# Patient Record
Sex: Male | Born: 1985 | Race: Black or African American | Hispanic: No | Marital: Married | Smoking: Never smoker
Health system: Southern US, Community
[De-identification: ages and names within clinical notes are randomized; demographics above are authoritative.]

## PROBLEM LIST (undated history)

## (undated) ENCOUNTER — Ambulatory Visit (HOSPITAL_COMMUNITY): Admission: EM | Disposition: A | Discharge status: Home / Self Care | Payer: Managed Care, Other (non HMO)

## (undated) DIAGNOSIS — E039 Hypothyroidism, unspecified: Secondary | ICD-10-CM

## (undated) DIAGNOSIS — E0590 Thyrotoxicosis, unspecified without thyrotoxic crisis or storm: Secondary | ICD-10-CM

## (undated) DIAGNOSIS — N289 Disorder of kidney and ureter, unspecified: Secondary | ICD-10-CM

## (undated) HISTORY — DX: Thyrotoxicosis, unspecified without thyrotoxic crisis or storm: E05.90

---
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
## 2006-06-26 ENCOUNTER — Emergency Department (HOSPITAL_COMMUNITY): Admission: EM | Admit: 2006-06-26 | Discharge: 2006-06-26 | Payer: Self-pay | Admitting: Emergency Medicine

## 2015-12-13 ENCOUNTER — Ambulatory Visit (INDEPENDENT_AMBULATORY_CARE_PROVIDER_SITE_OTHER): Payer: Managed Care, Other (non HMO)

## 2015-12-13 ENCOUNTER — Ambulatory Visit (HOSPITAL_COMMUNITY)
Admission: EM | Admit: 2015-12-13 | Discharge: 2015-12-13 | Disposition: A | Discharge status: Home / Self Care | Payer: Managed Care, Other (non HMO) | Attending: Family Medicine | Admitting: Family Medicine

## 2015-12-13 ENCOUNTER — Encounter (HOSPITAL_COMMUNITY): Payer: Self-pay | Admitting: Emergency Medicine

## 2015-12-13 DIAGNOSIS — S62316A Displaced fracture of base of fifth metacarpal bone, right hand, initial encounter for closed fracture: Secondary | ICD-10-CM | POA: Diagnosis not present

## 2015-12-13 MED ORDER — IBUPROFEN 800 MG PO TABS: 800.0000 mg | ORAL_TABLET | Freq: Three times a day (TID) | ORAL | 0 refills | Status: DC

## 2015-12-13 NOTE — Discharge Instructions (Signed)
Your hand is broken. Keep the splint on until you see the bone doctor. Call tomorrow to the Orthopedic office and schedule an appointment.
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
## 2015-12-13 NOTE — ED Notes (Signed)
Ortho Tech paged.
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
## 2015-12-14 ENCOUNTER — Other Ambulatory Visit (INDEPENDENT_AMBULATORY_CARE_PROVIDER_SITE_OTHER): Payer: Self-pay | Admitting: Orthopaedic Surgery

## 2015-12-14 ENCOUNTER — Ambulatory Visit (INDEPENDENT_AMBULATORY_CARE_PROVIDER_SITE_OTHER): Payer: Managed Care, Other (non HMO) | Admitting: Orthopaedic Surgery

## 2015-12-14 DIAGNOSIS — S62345A Nondisplaced fracture of base of fourth metacarpal bone, left hand, initial encounter for closed fracture: Secondary | ICD-10-CM

## 2015-12-14 DIAGNOSIS — S62346A Nondisplaced fracture of base of fifth metacarpal bone, right hand, initial encounter for closed fracture: Secondary | ICD-10-CM | POA: Diagnosis not present

## 2015-12-14 DIAGNOSIS — M79641 Pain in right hand: Secondary | ICD-10-CM

## 2015-12-19 ENCOUNTER — Ambulatory Visit
Admission: RE | Admit: 2015-12-19 | Discharge: 2015-12-19 | Disposition: A | Discharge status: Home / Self Care | Payer: Managed Care, Other (non HMO) | Source: Ambulatory Visit | Attending: Orthopaedic Surgery | Admitting: Orthopaedic Surgery

## 2015-12-19 DIAGNOSIS — M79641 Pain in right hand: Secondary | ICD-10-CM

## 2015-12-21 ENCOUNTER — Ambulatory Visit (INDEPENDENT_AMBULATORY_CARE_PROVIDER_SITE_OTHER): Payer: Managed Care, Other (non HMO) | Admitting: Orthopaedic Surgery

## 2015-12-21 DIAGNOSIS — S62346D Nondisplaced fracture of base of fifth metacarpal bone, right hand, subsequent encounter for fracture with routine healing: Secondary | ICD-10-CM

## 2016-01-11 ENCOUNTER — Ambulatory Visit (INDEPENDENT_AMBULATORY_CARE_PROVIDER_SITE_OTHER): Payer: Managed Care, Other (non HMO)

## 2016-01-11 ENCOUNTER — Telehealth (INDEPENDENT_AMBULATORY_CARE_PROVIDER_SITE_OTHER): Payer: Self-pay | Admitting: Orthopaedic Surgery

## 2016-01-11 ENCOUNTER — Encounter (INDEPENDENT_AMBULATORY_CARE_PROVIDER_SITE_OTHER): Payer: Self-pay | Admitting: Orthopaedic Surgery

## 2016-01-11 ENCOUNTER — Ambulatory Visit (INDEPENDENT_AMBULATORY_CARE_PROVIDER_SITE_OTHER): Payer: Managed Care, Other (non HMO) | Admitting: Orthopaedic Surgery

## 2016-01-11 DIAGNOSIS — S62345D Nondisplaced fracture of base of fourth metacarpal bone, left hand, subsequent encounter for fracture with routine healing: Secondary | ICD-10-CM | POA: Diagnosis not present

## 2016-01-11 DIAGNOSIS — S62346D Nondisplaced fracture of base of fifth metacarpal bone, right hand, subsequent encounter for fracture with routine healing: Secondary | ICD-10-CM

## 2016-01-11 NOTE — Progress Notes (Signed)
   Office Visit Note   Patient: Alan Watkins           Date of Birth: 05/10/1985           MRN: [id number] Visit Date: 01/11/2016              Requested by: No referring provider defined for this encounter. PCP: No PCP Per Patient   Assessment & Plan: Visit Diagnoses:  1. Closed nondisplaced fracture of base of fourth metacarpal bone of left hand with routine healing, subsequent encounter   2. Closed nondisplaced fracture of base of fifth metacarpal bone of right hand with routine healing, subsequent encounter     Plan:  - d/c cast - begin hand therapy x 4 weeks - velcro wrist brace at work - f/u 4 weeks, anticipate discharging at that time - may return to full duty  Follow-Up Instructions: Return in about 4 weeks (around 02/08/2016) for recheck right hand.   Orders:  Orders Placed This Encounter  Procedures  . XR Hand Complete Right   No orders of the defined types were placed in this encounter.     Procedures: No procedures performed   Clinical Data: No additional findings.   Subjective: Chief Complaint  Patient presents with  . Right Hand - Pain, Follow-up, Fracture    4 week f/u visit for 4th and 5th MC base fx.  Doing well.  Minimal pain.    Review of Systems   Objective: Vital Signs: There were no vitals taken for this visit.  Physical Exam  Right Hand Exam   Tenderness  The patient is experiencing tenderness in the dorsal area.  Range of Motion   Wrist  Extension: normal  Flexion: normal  Pronation: normal  Supination: normal   Comments:  Expected mild stiffness of fingers from casting.  No swelling.      Specialty Comments:  No specialty comments available.  Imaging: No results found.   PMFS History: There are no active problems to display for this patient.  No past medical history on file.  No family history on file.  No past surgical history on file. Social History   Occupational History  . Not on file.    Social History Main Topics  . Smoking status: Never Smoker  . Smokeless tobacco: Never Used  . Alcohol use Yes     Comment: Occasional  . Drug use: No  . Sexual activity: Not on file

## 2016-01-11 NOTE — Telephone Encounter (Signed)
Patient called advised he called the hand clinic on [address] and was told they did not have a certified hand specialist on site. Patient asked if he need to go to a certified hand specialist that was checked on the referral.  The number to contact patient is [phone]

## 2016-01-11 NOTE — Telephone Encounter (Signed)
Please advise 

## 2016-01-11 NOTE — Telephone Encounter (Signed)
he can see the OT there

## 2016-01-12 NOTE — Telephone Encounter (Signed)
[facility]

## 2016-01-16 ENCOUNTER — Encounter (INDEPENDENT_AMBULATORY_CARE_PROVIDER_SITE_OTHER): Payer: Self-pay | Admitting: Orthopaedic Surgery

## 2016-01-16 ENCOUNTER — Telehealth (INDEPENDENT_AMBULATORY_CARE_PROVIDER_SITE_OTHER): Payer: Self-pay | Admitting: Orthopaedic Surgery

## 2016-01-16 NOTE — Telephone Encounter (Signed)
Alan Watkins with PT and Hand specialist called needing a signed order and any recent notes faxed to her. Patient has an appointment tomorrow at 4:00pm  The fax # is [phone]   The ph# is [phone]

## 2016-02-09 ENCOUNTER — Ambulatory Visit (INDEPENDENT_AMBULATORY_CARE_PROVIDER_SITE_OTHER): Payer: Managed Care, Other (non HMO) | Admitting: Specialist

## 2017-03-11 HISTORY — PX: VASECTOMY: SHX75

## 2018-07-19 IMAGING — CT CT HAND*R* W/O CM
3 of 4 series · 12 of 29 positions shown, 14 images · non-contrast
Comparison: None.

CLINICAL DATA: Fifth metacarpal base fracture.  Hand pain.

EXAM:
CT OF THE RIGHT HAND WITHOUT CONTRAST
TECHNIQUE: Multidetector CT imaging of the right hand was performed according
to the standard protocol. Multiplanar CT image reconstructions were
also generated.

[Series 5: upper ext soft · axial · 0.38mm/px · z∈[+28,+146]mm · 3 of 95 slices shown]
[im 24/95  soft-tissue]
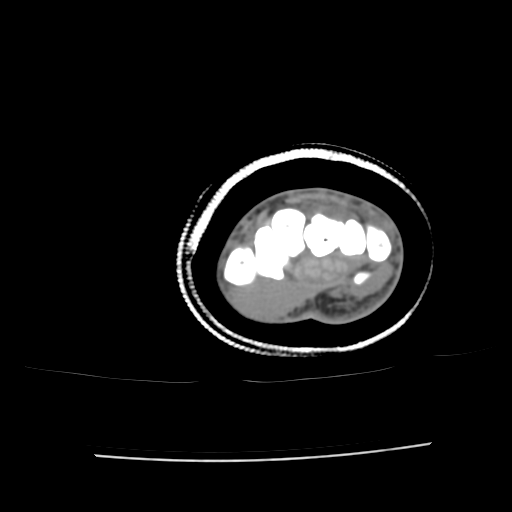
[im 48/95  soft-tissue]
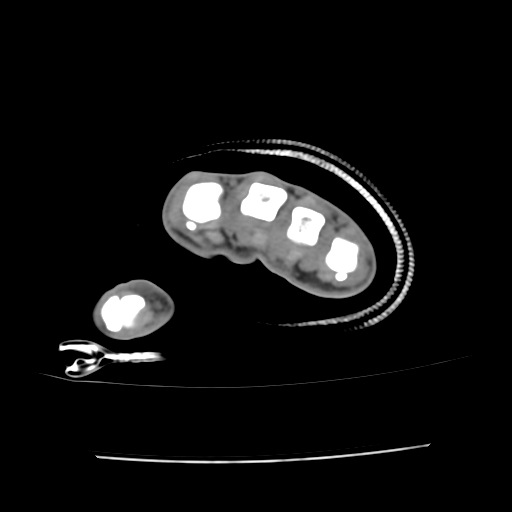
[im 71/95  soft-tissue]
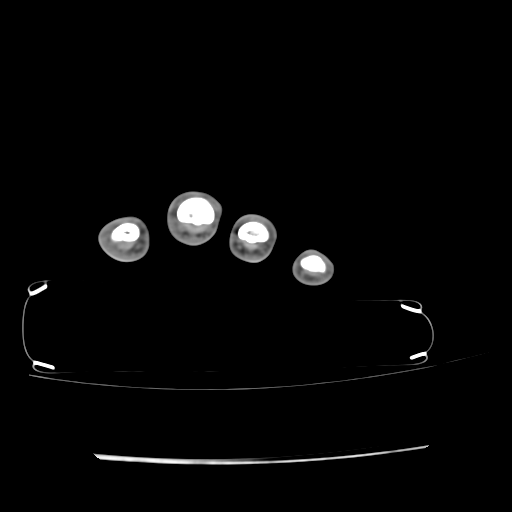

[Series 300: sag soft · sagittal · 0.47mm/px · 5 of 69 slices shown, 6 images]
[im 18/69  bone]
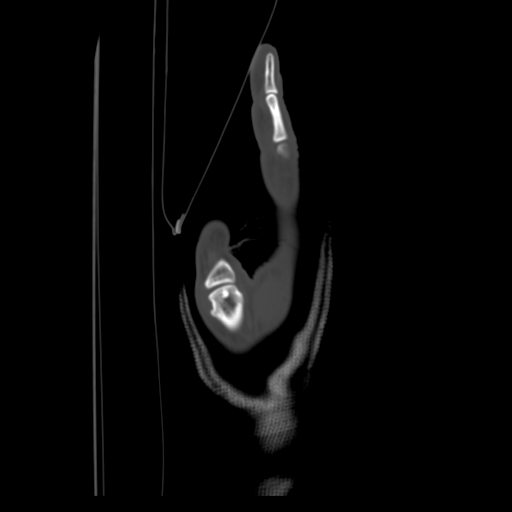
[im 23/69  bone]
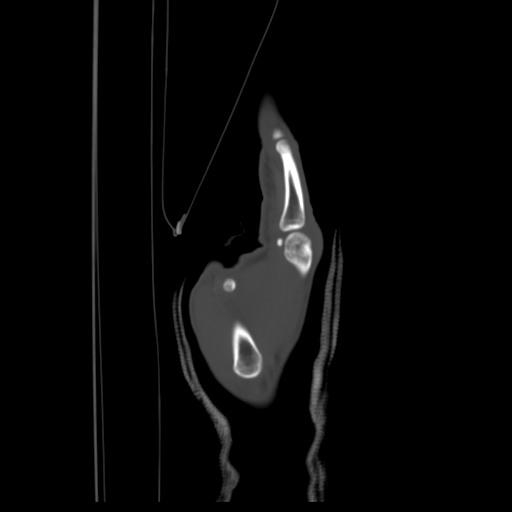
[im 29/69  bone]
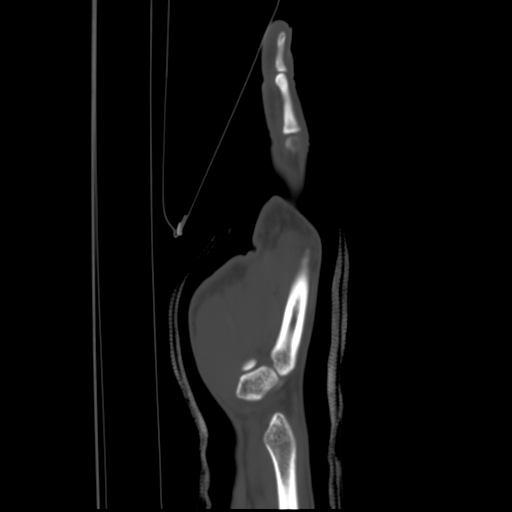
[im 35/69  soft-tissue]
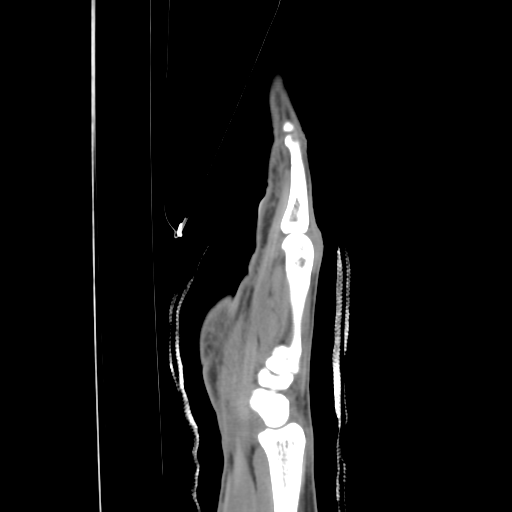
[im 35/69  bone]
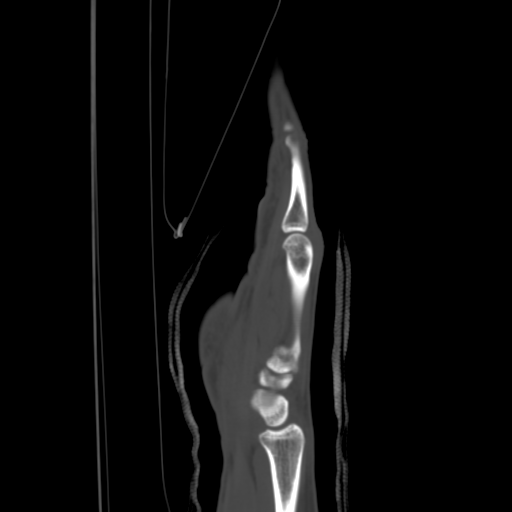
[im 40/69  bone]
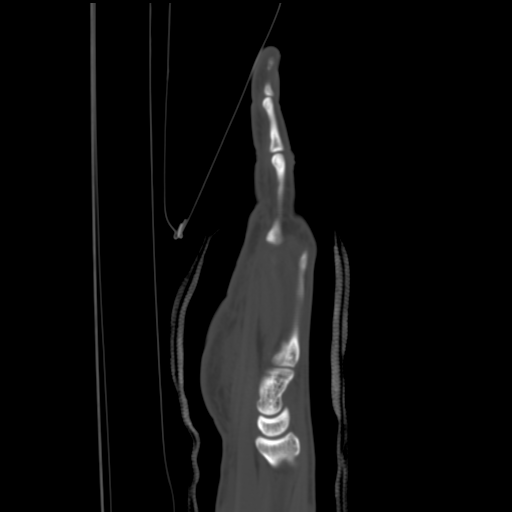

[Series 301: axial soft · axial · 0.47mm/px · z∈[+55,+194]mm · 4 of 121 slices shown, 5 images]
[im 25/121  soft-tissue]
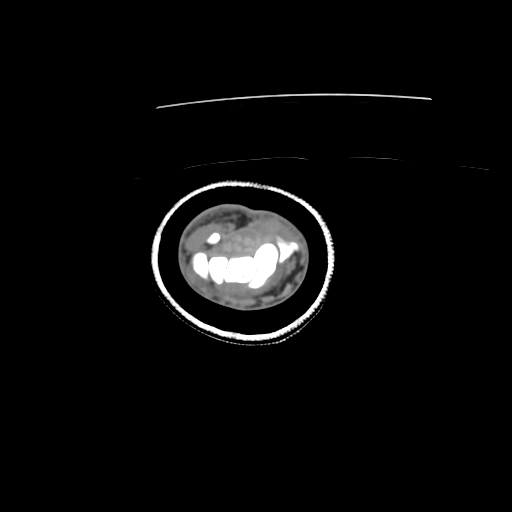
[im 25/121  bone]
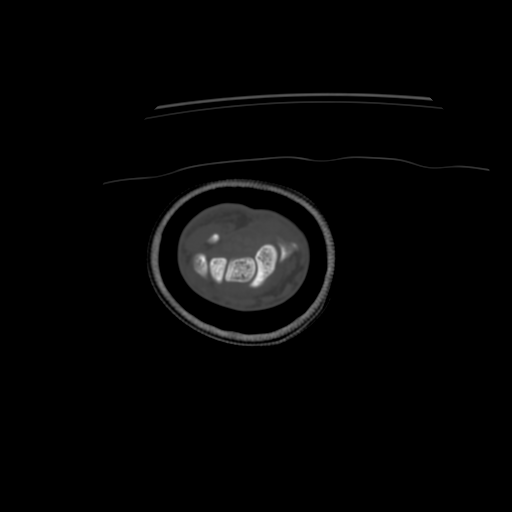
[im 49/121  bone]
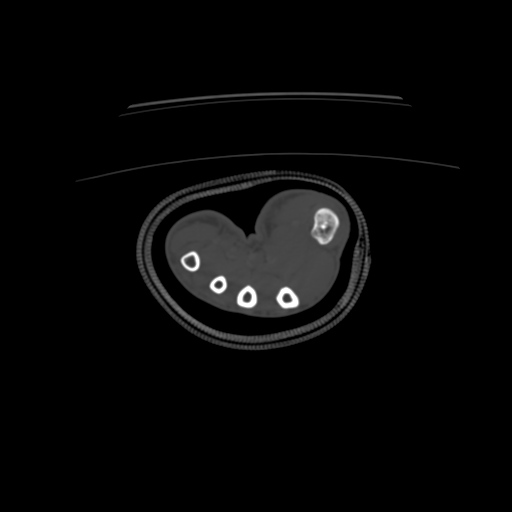
[im 73/121  bone]
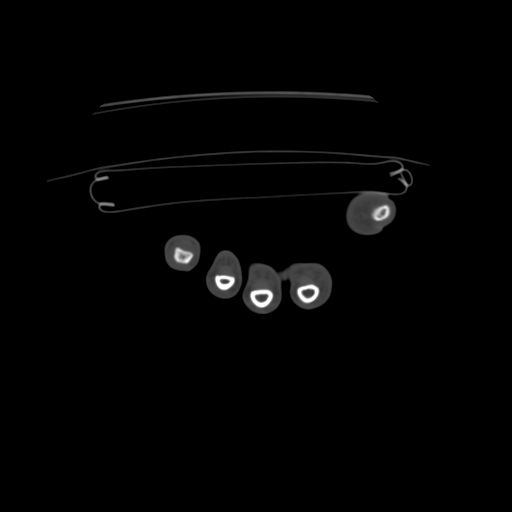
[im 97/121  bone]
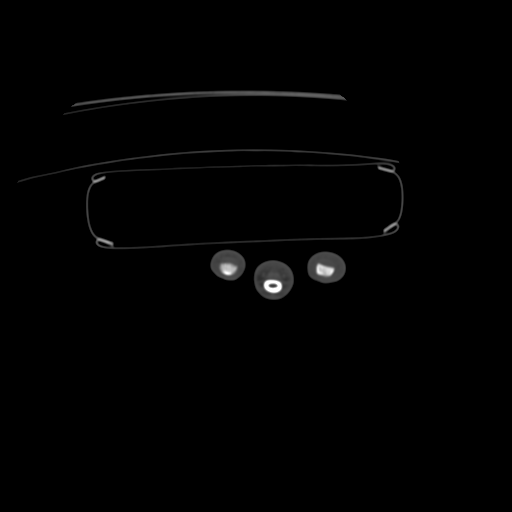

[12 of 29 positions shown; findings below may reference images not displayed]

FINDINGS: Bones/Joint/Cartilage

Displaced fracture of the base of the right fifth metacarpal
involving the articular surface at the CMC joint. 8 mm of peripheral
displacement and 8 mm of foreshortening.

No other fracture or dislocation. Ulnar minus variance. No
periosteal reaction or bone destruction. Otherwise normal alignment.

Ligaments

Suboptimally assessed by CT.

Muscles and Tendons

Normal muscles.  Intact flexor and extensor compartment tendons.

Soft tissues

No fluid collection or hematoma.
IMPRESSION: 1. Displaced fracture of the base of the right fifth metacarpal
involving the articular surface at the CMC joint. 8 mm of peripheral
displacement and 8 mm of foreshortening.

## 2019-01-02 ENCOUNTER — Other Ambulatory Visit: Payer: Self-pay

## 2019-01-02 DIAGNOSIS — Z20822 Contact with and (suspected) exposure to covid-19: Secondary | ICD-10-CM

## 2019-01-04 LAB — NOVEL CORONAVIRUS, NAA: SARS-CoV-2, NAA: NOT DETECTED

## 2021-04-20 ENCOUNTER — Ambulatory Visit (INDEPENDENT_AMBULATORY_CARE_PROVIDER_SITE_OTHER): Payer: 59 | Admitting: Family Medicine

## 2021-04-20 ENCOUNTER — Other Ambulatory Visit: Payer: Self-pay

## 2021-04-20 ENCOUNTER — Encounter: Payer: Self-pay | Admitting: Family Medicine

## 2021-04-20 VITALS — BP 120/74 | HR 65 | Temp 98.4°F | Ht 69.5 in | Wt 161.2 lb

## 2021-04-20 DIAGNOSIS — Z23 Encounter for immunization: Secondary | ICD-10-CM | POA: Diagnosis not present

## 2021-04-20 DIAGNOSIS — R946 Abnormal results of thyroid function studies: Secondary | ICD-10-CM | POA: Diagnosis not present

## 2021-04-20 DIAGNOSIS — R5383 Other fatigue: Secondary | ICD-10-CM

## 2021-04-20 LAB — CBC WITH DIFFERENTIAL/PLATELET
Basophils Absolute: 0 10*3/uL (ref 0.0–0.1)
Basophils Relative: 0.7 % (ref 0.0–3.0)
Eosinophils Absolute: 0.1 10*3/uL (ref 0.0–0.7)
Eosinophils Relative: 1.8 % (ref 0.0–5.0)
HCT: 41.6 % (ref 39.0–52.0)
Hemoglobin: 13.8 g/dL (ref 13.0–17.0)
Lymphocytes Relative: 42.2 % (ref 12.0–46.0)
Lymphs Abs: 1.4 10*3/uL (ref 0.7–4.0)
MCHC: 33.2 g/dL (ref 30.0–36.0)
MCV: 86.2 fl (ref 78.0–100.0)
Monocytes Absolute: 0.4 10*3/uL (ref 0.1–1.0)
Monocytes Relative: 10.5 % (ref 3.0–12.0)
Neutro Abs: 1.5 10*3/uL (ref 1.4–7.7)
Neutrophils Relative %: 44.8 % (ref 43.0–77.0)
Platelets: 206 10*3/uL (ref 150.0–400.0)
RBC: 4.83 Mil/uL (ref 4.22–5.81)
RDW: 13.6 % (ref 11.5–15.5)
WBC: 3.4 10*3/uL — ABNORMAL LOW (ref 4.0–10.5)

## 2021-04-20 LAB — URINALYSIS, ROUTINE W REFLEX MICROSCOPIC
Bilirubin Urine: NEGATIVE
Hgb urine dipstick: NEGATIVE
Ketones, ur: NEGATIVE
Leukocytes,Ua: NEGATIVE
Nitrite: NEGATIVE
Specific Gravity, Urine: 1.02 (ref 1.000–1.030)
Total Protein, Urine: NEGATIVE
Urine Glucose: NEGATIVE
Urobilinogen, UA: 1 (ref 0.0–1.0)
pH: 7.5 (ref 5.0–8.0)

## 2021-04-20 LAB — COMPREHENSIVE METABOLIC PANEL
ALT: 18 U/L (ref 0–53)
AST: 18 U/L (ref 0–37)
Albumin: 4.2 g/dL (ref 3.5–5.2)
Alkaline Phosphatase: 61 U/L (ref 39–117)
BUN: 13 mg/dL (ref 6–23)
CO2: 34 mEq/L — ABNORMAL HIGH (ref 19–32)
Calcium: 9.9 mg/dL (ref 8.4–10.5)
Chloride: 101 mEq/L (ref 96–112)
Creatinine, Ser: 1.1 mg/dL (ref 0.40–1.50)
GFR: 87.16 mL/min (ref >60.00)
Glucose, Bld: 75 mg/dL (ref 70–99)
Potassium: 4.5 mEq/L (ref 3.5–5.1)
Sodium: 138 mEq/L (ref 135–145)
Total Bilirubin: 0.4 mg/dL (ref 0.2–1.2)
Total Protein: 7.4 g/dL (ref 6.0–8.3)

## 2021-04-20 LAB — IBC + FERRITIN
Ferritin: 92.8 ng/mL (ref 22.0–322.0)
Iron: 80 ug/dL (ref 42–165)
Saturation Ratios: 23.2 % (ref 20.0–50.0)
TIBC: 344.4 ug/dL (ref 250.0–450.0)
Transferrin: 246 mg/dL (ref 212.0–360.0)

## 2021-04-20 LAB — TSH: TSH: 0.11 u[IU]/mL — ABNORMAL LOW (ref 0.35–5.50)

## 2021-04-20 LAB — VITAMIN B12: Vitamin B-12: 319 pg/mL (ref 211–911)

## 2021-04-20 NOTE — Patient Instructions (Signed)
Welcome to [facility]! It was a pleasure meeting you today.  As discussed, Please schedule a 12 month follow up visit today.  L-Glutamine vitamin.    Electrolytes-one/day.  PLEASE NOTE:  If you had any LAB tests please let us know if you have not heard back within a few days. You may see your results on MyChart before we have a chance to review them but we will give you a call once they are reviewed by [place]. If we ordered any REFERRALS today, please let us know if you have not heard from their office within the next week.  Let us know through MyChart if you are needing REFILLS, or have your pharmacy send [place] the request. You can also use MyChart to communicate with me or any office staff.  Please try these tips to maintain a healthy lifestyle:  Eat most of your calories during the day when you are active. Eliminate processed foods including packaged sweets (pies, cakes, cookies), reduce intake of potatoes, white bread, white pasta, and white rice. Look for whole grain options, oat flour or almond flour.  Each meal should contain half fruits/vegetables, one quarter protein, and one quarter carbs (no bigger than a computer mouse).  Cut down on sweet beverages. This includes juice, soda, and sweet tea. Also watch fruit intake, though this is a healthier sweet option, it still contains natural sugar! Limit to 3 servings daily.  Drink at least 1 glass of water with each meal and aim for at least 8 glasses per day  Exercise at least 150 minutes every week.

## 2021-04-20 NOTE — Addendum Note (Signed)
Addended by: Angelena Sole on: 04/20/2021 05:15 PM   Modules accepted: Orders
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
## 2021-05-04 ENCOUNTER — Other Ambulatory Visit: Payer: Self-pay | Admitting: *Deleted

## 2021-05-04 ENCOUNTER — Other Ambulatory Visit (INDEPENDENT_AMBULATORY_CARE_PROVIDER_SITE_OTHER): Payer: 59

## 2021-05-04 ENCOUNTER — Other Ambulatory Visit: Payer: Self-pay

## 2021-05-04 DIAGNOSIS — R946 Abnormal results of thyroid function studies: Secondary | ICD-10-CM | POA: Diagnosis not present

## 2021-05-04 DIAGNOSIS — R5383 Other fatigue: Secondary | ICD-10-CM

## 2021-05-04 LAB — T4, FREE: Free T4: 0.75 ng/dL (ref 0.60–1.60)

## 2021-05-04 LAB — TESTOSTERONE: Testosterone: 441.26 ng/dL (ref 300.00–890.00)

## 2021-05-04 LAB — T3, FREE: T3, Free: 3.7 pg/mL (ref 2.3–4.2)

## 2021-06-01 ENCOUNTER — Other Ambulatory Visit: Payer: 59

## 2021-06-08 ENCOUNTER — Other Ambulatory Visit (INDEPENDENT_AMBULATORY_CARE_PROVIDER_SITE_OTHER): Payer: 59

## 2021-06-08 DIAGNOSIS — R946 Abnormal results of thyroid function studies: Secondary | ICD-10-CM | POA: Diagnosis not present

## 2021-06-08 LAB — T4, FREE: Free T4: 0.75 ng/dL (ref 0.60–1.60)

## 2021-06-08 LAB — TSH: TSH: 0.16 u[IU]/mL — ABNORMAL LOW (ref 0.35–5.50)

## 2021-06-08 LAB — T3, FREE: T3, Free: 3.5 pg/mL (ref 2.3–4.2)

## 2022-03-15 ENCOUNTER — Ambulatory Visit (INDEPENDENT_AMBULATORY_CARE_PROVIDER_SITE_OTHER): Payer: BC Managed Care – PPO | Admitting: Internal Medicine

## 2022-03-15 ENCOUNTER — Encounter: Payer: Self-pay | Admitting: Internal Medicine

## 2022-03-15 VITALS — BP 118/72 | HR 58 | Ht 69.5 in | Wt 175.0 lb

## 2022-03-15 DIAGNOSIS — E559 Vitamin D deficiency, unspecified: Secondary | ICD-10-CM | POA: Diagnosis not present

## 2022-03-15 DIAGNOSIS — E0590 Thyrotoxicosis, unspecified without thyrotoxic crisis or storm: Secondary | ICD-10-CM

## 2022-03-15 LAB — T4, FREE: Free T4: 0.8 ng/dL (ref 0.60–1.60)

## 2022-03-15 LAB — COMPREHENSIVE METABOLIC PANEL
ALT: 23 U/L (ref 0–53)
AST: 22 U/L (ref 0–37)
Albumin: 4.4 g/dL (ref 3.5–5.2)
Alkaline Phosphatase: 69 U/L (ref 39–117)
BUN: 14 mg/dL (ref 6–23)
CO2: 30 mEq/L (ref 19–32)
Calcium: 9.7 mg/dL (ref 8.4–10.5)
Chloride: 100 mEq/L (ref 96–112)
Creatinine, Ser: 0.96 mg/dL (ref 0.40–1.50)
GFR: 101.98 mL/min (ref >60.00)
Glucose, Bld: 85 mg/dL (ref 70–99)
Potassium: 4.4 mEq/L (ref 3.5–5.1)
Sodium: 137 mEq/L (ref 135–145)
Total Bilirubin: 0.4 mg/dL (ref 0.2–1.2)
Total Protein: 7.7 g/dL (ref 6.0–8.3)

## 2022-03-15 LAB — CBC WITH DIFFERENTIAL/PLATELET
Basophils Absolute: 0 10*3/uL (ref 0.0–0.1)
Basophils Relative: 0.6 % (ref 0.0–3.0)
Eosinophils Absolute: 0.1 10*3/uL (ref 0.0–0.7)
Eosinophils Relative: 1.5 % (ref 0.0–5.0)
HCT: 41.9 % (ref 39.0–52.0)
Hemoglobin: 14.2 g/dL (ref 13.0–17.0)
Lymphocytes Relative: 38.5 % (ref 12.0–46.0)
Lymphs Abs: 1.5 10*3/uL (ref 0.7–4.0)
MCHC: 33.7 g/dL (ref 30.0–36.0)
MCV: 86.4 fl (ref 78.0–100.0)
Monocytes Absolute: 0.4 10*3/uL (ref 0.1–1.0)
Monocytes Relative: 10.4 % (ref 3.0–12.0)
Neutro Abs: 1.9 10*3/uL (ref 1.4–7.7)
Neutrophils Relative %: 49 % (ref 43.0–77.0)
Platelets: 215 10*3/uL (ref 150.0–400.0)
RBC: 4.85 Mil/uL (ref 4.22–5.81)
RDW: 13.4 % (ref 11.5–15.5)
WBC: 3.9 10*3/uL — ABNORMAL LOW (ref 4.0–10.5)

## 2022-03-15 LAB — VITAMIN D 25 HYDROXY (VIT D DEFICIENCY, FRACTURES): VITD: 13.04 ng/mL — ABNORMAL LOW (ref 30.00–100.00)

## 2022-03-15 LAB — TSH: TSH: 0.2 u[IU]/mL — ABNORMAL LOW (ref 0.35–5.50)

## 2022-03-15 NOTE — Progress Notes (Unsigned)
Name: Alan Watkins  MRN/ DOB: [id number], 01-17-1986    Age/ Sex: 37 y.o., male    PCP: Tawnya Crook, MD   Reason for Endocrinology Evaluation: Subclinical hyperthyroidism     Date of Initial Endocrinology Evaluation: 03/15/2022     HPI: Alan Watkins is a 37 y.o. male with an unremarkable past medical history . The patient presented for initial endocrinology clinic visit on 03/15/2022 for consultative assistance with his subclinical hyperthyroidism.   Patient was diagnosed with subclinical hyperthyroidism in 04/2021 with low TSH of 0.11u IU/mL normal free T4 0.75 ng/dL and normal free T3 at 3.7 PG/mL, this results were confirmed in March 2023. 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 Past Surgical History:  Past Surgical History:  Procedure Laterality Date   VASECTOMY  2019    Social History:  reports that he has never smoked. He has never used smokeless tobacco. He reports current alcohol use of about 2.0 - 4.0 standard drinks of alcohol per week. He reports that he does not currently use drugs after having used the following drugs: Marijuana. Family History: family history includes Alcohol abuse in his maternal grandmother; Diabetes in his maternal grandmother and mother; Hypertension in his maternal grandmother and mother.   HOME MEDICATIONS: Allergies as of 03/15/2022   No Known Allergies      Medication List        Accurate as of  March 15, 2022  7:21 AM. If you have any questions, ask your nurse or doctor.          ibuprofen 100 MG/5ML suspension Commonly known as: ADVIL Take 200 mg by mouth every 4 (four) hours as needed.   multivitamin tablet Take 1 tablet by mouth daily.          REVIEW OF SYSTEMS: A comprehensive ROS was conducted with the patient and is negative except as per HPI and below:  ROS     OBJECTIVE:  VS: There were no vitals taken for this visit.   Wt Readings from Last 3 Encounters:  04/20/21 161 lb 4 oz (73.1 kg)     EXAM: General: Pt appears well and is in NAD  Eyes: External eye exam normal without stare, lid lag or exophthalmos.  EOM intact.  PERRL.  Neck: General: Supple without adenopathy. Thyroid: Thyroid size normal.  No goiter or nodules appreciated. No thyroid bruit.  Lungs: Clear with good BS bilat with no rales, rhonchi, or wheezes  Heart: Auscultation: RRR.  Abdomen: Normoactive bowel sounds, soft, nontender, without masses or organomegaly palpable  Extremities:  BL LE: No pretibial edema normal ROM and strength.  Mental Status: Judgment, insight: Intact Orientation: Oriented to time, place, and person Mood and affect: No depression, anxiety, or agitation     DATA REVIEWED: ***    ASSESSMENT/PLAN/RECOMMENDATIONS:   Subclinical Hyperthyroidism:   -Patient with fatigue and occasional  palpitations -We discussed the differential diagnosis to include subacute thyroiditis, Graves' disease, autonomous thyroid nodule -    Medications :   Follow-up in 4 months Signed electronically by: Mack Guise, MD  [facility]  [facility] [address] [place], [place] [address] Phone: [phone] FAX: [phone]   CC: Tawnya Crook, [place] [address] Phone: [phone] Fax: [phone]   Return to Endocrinology clinic as below: Future Appointments  Date Time  Provider [place]  03/15/2022  1:40 PM Mauriah Mcmillen, Melanie Crazier, MD [facility] None

## 2022-03-16 LAB — T3: T3, Total: 140 ng/dL (ref 76–181)

## 2022-03-18 LAB — TRAB (TSH RECEPTOR BINDING ANTIBODY): TRAB: <1 IU/L (ref <=2.00)

## 2022-03-18 MED ORDER — METHIMAZOLE 5 MG PO TABS: 5.0000 mg | ORAL_TABLET | Freq: Every day | ORAL | 1 refills | Status: DC

## 2022-03-18 MED ORDER — ERGOCALCIFEROL 1.25 MG (50000 UT) PO CAPS: 50000.0000 [IU] | ORAL_CAPSULE | ORAL | 2 refills | Status: DC

## 2022-03-20 ENCOUNTER — Other Ambulatory Visit: Payer: Self-pay

## 2022-03-20 MED ORDER — METHIMAZOLE 5 MG PO TABS: 5.0000 mg | ORAL_TABLET | Freq: Every day | ORAL | 1 refills | Status: DC

## 2022-04-13 ENCOUNTER — Emergency Department (HOSPITAL_COMMUNITY): Payer: BC Managed Care – PPO

## 2022-04-13 ENCOUNTER — Inpatient Hospital Stay (HOSPITAL_COMMUNITY): Payer: BC Managed Care – PPO

## 2022-04-13 ENCOUNTER — Inpatient Hospital Stay (HOSPITAL_COMMUNITY)
Admission: EM | Admit: 2022-04-13 | Discharge: 2022-04-19 | DRG: 957 | Disposition: A | Discharge status: Home / Self Care | Payer: BC Managed Care – PPO | Attending: General Surgery | Admitting: General Surgery

## 2022-04-13 DIAGNOSIS — J942 Hemothorax: Secondary | ICD-10-CM | POA: Diagnosis present

## 2022-04-13 DIAGNOSIS — S27808A Other injury of diaphragm, initial encounter: Secondary | ICD-10-CM | POA: Diagnosis not present

## 2022-04-13 DIAGNOSIS — S12600A Unspecified displaced fracture of seventh cervical vertebra, initial encounter for closed fracture: Secondary | ICD-10-CM | POA: Diagnosis not present

## 2022-04-13 DIAGNOSIS — S42191A Fracture of other part of scapula, right shoulder, initial encounter for closed fracture: Secondary | ICD-10-CM | POA: Diagnosis not present

## 2022-04-13 DIAGNOSIS — Z4789 Encounter for other orthopedic aftercare: Secondary | ICD-10-CM | POA: Diagnosis not present

## 2022-04-13 DIAGNOSIS — S82832A Other fracture of upper and lower end of left fibula, initial encounter for closed fracture: Secondary | ICD-10-CM | POA: Diagnosis not present

## 2022-04-13 DIAGNOSIS — J9811 Atelectasis: Secondary | ICD-10-CM | POA: Diagnosis not present

## 2022-04-13 DIAGNOSIS — S42101A Fracture of unspecified part of scapula, right shoulder, initial encounter for closed fracture: Secondary | ICD-10-CM | POA: Diagnosis not present

## 2022-04-13 DIAGNOSIS — J984 Other disorders of lung: Secondary | ICD-10-CM | POA: Diagnosis not present

## 2022-04-13 DIAGNOSIS — S42102A Fracture of unspecified part of scapula, left shoulder, initial encounter for closed fracture: Secondary | ICD-10-CM | POA: Diagnosis not present

## 2022-04-13 DIAGNOSIS — D62 Acute posthemorrhagic anemia: Secondary | ICD-10-CM | POA: Diagnosis not present

## 2022-04-13 DIAGNOSIS — S272XXA Traumatic hemopneumothorax, initial encounter: Principal | ICD-10-CM | POA: Diagnosis present

## 2022-04-13 DIAGNOSIS — S42113A Displaced fracture of body of scapula, unspecified shoulder, initial encounter for closed fracture: Secondary | ICD-10-CM | POA: Diagnosis not present

## 2022-04-13 DIAGNOSIS — S270XXA Traumatic pneumothorax, initial encounter: Secondary | ICD-10-CM | POA: Diagnosis not present

## 2022-04-13 DIAGNOSIS — S82252A Displaced comminuted fracture of shaft of left tibia, initial encounter for closed fracture: Secondary | ICD-10-CM

## 2022-04-13 DIAGNOSIS — S2249XA Multiple fractures of ribs, unspecified side, initial encounter for closed fracture: Secondary | ICD-10-CM

## 2022-04-13 DIAGNOSIS — J9601 Acute respiratory failure with hypoxia: Secondary | ICD-10-CM | POA: Diagnosis not present

## 2022-04-13 DIAGNOSIS — S0990XA Unspecified injury of head, initial encounter: Secondary | ICD-10-CM | POA: Diagnosis not present

## 2022-04-13 DIAGNOSIS — R042 Hemoptysis: Secondary | ICD-10-CM | POA: Diagnosis not present

## 2022-04-13 DIAGNOSIS — S99912A Unspecified injury of left ankle, initial encounter: Secondary | ICD-10-CM | POA: Diagnosis not present

## 2022-04-13 DIAGNOSIS — S82102A Unspecified fracture of upper end of left tibia, initial encounter for closed fracture: Secondary | ICD-10-CM | POA: Diagnosis not present

## 2022-04-13 DIAGNOSIS — S36020A Minor contusion of spleen, initial encounter: Secondary | ICD-10-CM | POA: Diagnosis not present

## 2022-04-13 DIAGNOSIS — E872 Acidosis, unspecified: Secondary | ICD-10-CM | POA: Diagnosis not present

## 2022-04-13 DIAGNOSIS — Z79899 Other long term (current) drug therapy: Secondary | ICD-10-CM

## 2022-04-13 DIAGNOSIS — S82402A Unspecified fracture of shaft of left fibula, initial encounter for closed fracture: Secondary | ICD-10-CM | POA: Diagnosis present

## 2022-04-13 DIAGNOSIS — S2242XA Multiple fractures of ribs, left side, initial encounter for closed fracture: Secondary | ICD-10-CM | POA: Diagnosis not present

## 2022-04-13 DIAGNOSIS — Z8249 Family history of ischemic heart disease and other diseases of the circulatory system: Secondary | ICD-10-CM | POA: Diagnosis not present

## 2022-04-13 DIAGNOSIS — S27321A Contusion of lung, unilateral, initial encounter: Secondary | ICD-10-CM | POA: Diagnosis not present

## 2022-04-13 DIAGNOSIS — S42112A Displaced fracture of body of scapula, left shoulder, initial encounter for closed fracture: Secondary | ICD-10-CM | POA: Diagnosis not present

## 2022-04-13 DIAGNOSIS — S3600XA Unspecified injury of spleen, initial encounter: Secondary | ICD-10-CM | POA: Diagnosis not present

## 2022-04-13 DIAGNOSIS — S27322A Contusion of lung, bilateral, initial encounter: Secondary | ICD-10-CM | POA: Diagnosis present

## 2022-04-13 DIAGNOSIS — Z833 Family history of diabetes mellitus: Secondary | ICD-10-CM | POA: Diagnosis not present

## 2022-04-13 DIAGNOSIS — R0902 Hypoxemia: Secondary | ICD-10-CM | POA: Diagnosis not present

## 2022-04-13 DIAGNOSIS — E039 Hypothyroidism, unspecified: Secondary | ICD-10-CM | POA: Diagnosis present

## 2022-04-13 DIAGNOSIS — Y92410 Unspecified street and highway as the place of occurrence of the external cause: Secondary | ICD-10-CM | POA: Diagnosis not present

## 2022-04-13 DIAGNOSIS — S82202A Unspecified fracture of shaft of left tibia, initial encounter for closed fracture: Secondary | ICD-10-CM | POA: Diagnosis not present

## 2022-04-13 DIAGNOSIS — Z041 Encounter for examination and observation following transport accident: Secondary | ICD-10-CM | POA: Diagnosis not present

## 2022-04-13 DIAGNOSIS — S2243XA Multiple fractures of ribs, bilateral, initial encounter for closed fracture: Secondary | ICD-10-CM | POA: Diagnosis not present

## 2022-04-13 DIAGNOSIS — R0789 Other chest pain: Secondary | ICD-10-CM | POA: Diagnosis not present

## 2022-04-13 DIAGNOSIS — S42192A Fracture of other part of scapula, left shoulder, initial encounter for closed fracture: Secondary | ICD-10-CM | POA: Diagnosis not present

## 2022-04-13 DIAGNOSIS — S42109A Fracture of unspecified part of scapula, unspecified shoulder, initial encounter for closed fracture: Secondary | ICD-10-CM

## 2022-04-13 DIAGNOSIS — J939 Pneumothorax, unspecified: Secondary | ICD-10-CM | POA: Diagnosis not present

## 2022-04-13 DIAGNOSIS — J439 Emphysema, unspecified: Secondary | ICD-10-CM | POA: Diagnosis not present

## 2022-04-13 DIAGNOSIS — S271XXA Traumatic hemothorax, initial encounter: Secondary | ICD-10-CM | POA: Diagnosis not present

## 2022-04-13 HISTORY — DX: Hypothyroidism, unspecified: E03.9

## 2022-04-13 LAB — CBC
HCT: 41 % (ref 39.0–52.0)
HCT: 36 % — ABNORMAL LOW (ref 39.0–52.0)
HCT: 37.8 % — ABNORMAL LOW (ref 39.0–52.0)
Hemoglobin: 13.3 g/dL (ref 13.0–17.0)
Hemoglobin: 11.8 g/dL — ABNORMAL LOW (ref 13.0–17.0)
Hemoglobin: 13.1 g/dL (ref 13.0–17.0)
MCH: 28.5 pg (ref 26.0–34.0)
MCH: 28.9 pg (ref 26.0–34.0)
MCH: 29.9 pg (ref 26.0–34.0)
MCHC: 32.4 g/dL (ref 30.0–36.0)
MCHC: 32.8 g/dL (ref 30.0–36.0)
MCHC: 34.7 g/dL (ref 30.0–36.0)
MCV: 88 fL (ref 80.0–100.0)
MCV: 88 fL (ref 80.0–100.0)
MCV: 86.3 fL (ref 80.0–100.0)
Platelets: 273 10*3/uL (ref 150–400)
Platelets: 236 10*3/uL (ref 150–400)
Platelets: 208 10*3/uL (ref 150–400)
RBC: 4.66 MIL/uL (ref 4.22–5.81)
RBC: 4.09 MIL/uL — ABNORMAL LOW (ref 4.22–5.81)
RBC: 4.38 MIL/uL (ref 4.22–5.81)
RDW: 13.2 % (ref 11.5–15.5)
RDW: 13 % (ref 11.5–15.5)
RDW: 13.1 % (ref 11.5–15.5)
WBC: 10.4 10*3/uL (ref 4.0–10.5)
WBC: 16.4 10*3/uL — ABNORMAL HIGH (ref 4.0–10.5)
WBC: 11.4 10*3/uL — ABNORMAL HIGH (ref 4.0–10.5)
nRBC: 0 % (ref 0.0–0.2)
nRBC: 0 % (ref 0.0–0.2)
nRBC: 0 % (ref 0.0–0.2)

## 2022-04-13 LAB — SAMPLE TO BLOOD BANK

## 2022-04-13 LAB — COMPREHENSIVE METABOLIC PANEL
ALT: 41 U/L (ref 0–44)
AST: 61 U/L — ABNORMAL HIGH (ref 15–41)
Albumin: 3.7 g/dL (ref 3.5–5.0)
Alkaline Phosphatase: 72 U/L (ref 38–126)
Anion gap: 13 (ref 5–15)
BUN: 10 mg/dL (ref 6–20)
CO2: 22 mmol/L (ref 22–32)
Calcium: 8.7 mg/dL — ABNORMAL LOW (ref 8.9–10.3)
Chloride: 102 mmol/L (ref 98–111)
Creatinine, Ser: 1.1 mg/dL (ref 0.61–1.24)
GFR, Estimated: >60 mL/min (ref >60)
Glucose, Bld: 163 mg/dL — ABNORMAL HIGH (ref 70–99)
Potassium: 3.1 mmol/L — ABNORMAL LOW (ref 3.5–5.1)
Sodium: 137 mmol/L (ref 135–145)
Total Bilirubin: 0.3 mg/dL (ref 0.3–1.2)
Total Protein: 7 g/dL (ref 6.5–8.1)

## 2022-04-13 LAB — I-STAT CHEM 8, ED
BUN: 10 mg/dL (ref 6–20)
Calcium, Ion: 1.1 mmol/L — ABNORMAL LOW (ref 1.15–1.40)
Chloride: 103 mmol/L (ref 98–111)
Creatinine, Ser: 1.2 mg/dL (ref 0.61–1.24)
Glucose, Bld: 162 mg/dL — ABNORMAL HIGH (ref 70–99)
HCT: 41 % (ref 39.0–52.0)
Hemoglobin: 13.9 g/dL (ref 13.0–17.0)
Potassium: 3.2 mmol/L — ABNORMAL LOW (ref 3.5–5.1)
Sodium: 140 mmol/L (ref 135–145)
TCO2: 22 mmol/L (ref 22–32)

## 2022-04-13 LAB — PROTIME-INR
INR: 1 (ref 0.8–1.2)
Prothrombin Time: 13.5 seconds (ref 11.4–15.2)

## 2022-04-13 LAB — BASIC METABOLIC PANEL
Anion gap: 11 (ref 5–15)
BUN: 10 mg/dL (ref 6–20)
CO2: 25 mmol/L (ref 22–32)
Calcium: 8.5 mg/dL — ABNORMAL LOW (ref 8.9–10.3)
Chloride: 102 mmol/L (ref 98–111)
Creatinine, Ser: 0.99 mg/dL (ref 0.61–1.24)
GFR, Estimated: >60 mL/min (ref >60)
Glucose, Bld: 104 mg/dL — ABNORMAL HIGH (ref 70–99)
Potassium: 3.5 mmol/L (ref 3.5–5.1)
Sodium: 138 mmol/L (ref 135–145)

## 2022-04-13 LAB — TROPONIN I (HIGH SENSITIVITY): Troponin I (High Sensitivity): 22 ng/L — ABNORMAL HIGH (ref <18)

## 2022-04-13 LAB — ETHANOL: Alcohol, Ethyl (B): 28 mg/dL — ABNORMAL HIGH (ref <10)

## 2022-04-13 LAB — URINALYSIS, ROUTINE W REFLEX MICROSCOPIC
Bacteria, UA: NONE SEEN
Bilirubin Urine: NEGATIVE
Glucose, UA: NEGATIVE mg/dL
Ketones, ur: NEGATIVE mg/dL
Leukocytes,Ua: NEGATIVE
Nitrite: NEGATIVE
Protein, ur: 100 mg/dL — AB
RBC / HPF: >50 RBC/hpf (ref 0–5)
Specific Gravity, Urine: 1.033 — ABNORMAL HIGH (ref 1.005–1.030)
pH: 5 (ref 5.0–8.0)

## 2022-04-13 LAB — CREATININE, SERUM
Creatinine, Ser: 1 mg/dL (ref 0.61–1.24)
GFR, Estimated: >60 mL/min (ref >60)

## 2022-04-13 LAB — HIV ANTIBODY (ROUTINE TESTING W REFLEX): HIV Screen 4th Generation wRfx: NONREACTIVE

## 2022-04-13 LAB — LACTIC ACID, PLASMA: Lactic Acid, Venous: 3.7 mmol/L (ref 0.5–1.9)

## 2022-04-13 MED ORDER — FENTANYL CITRATE PF 50 MCG/ML IJ SOSY: 50.0000 ug | PREFILLED_SYRINGE | Freq: Once | INTRAMUSCULAR | Status: AC

## 2022-04-13 MED ORDER — ONDANSETRON HCL 4 MG/2ML IJ SOLN
4.0000 mg | Freq: Four times a day (QID) | INTRAMUSCULAR | Status: DC | PRN
  Administered 2022-04-14 – 2022-04-17 (×2): 4 mg via INTRAVENOUS
  Filled 2022-04-13 (×2): qty 2

## 2022-04-13 MED ORDER — CHLORHEXIDINE GLUCONATE CLOTH 2 % EX PADS
6.0000 | MEDICATED_PAD | Freq: Every day | CUTANEOUS | Status: DC
  Administered 2022-04-14 – 2022-04-18 (×4): 6 via TOPICAL

## 2022-04-13 MED ORDER — ENOXAPARIN SODIUM 30 MG/0.3ML IJ SOSY
30.0000 mg | PREFILLED_SYRINGE | Freq: Two times a day (BID) | INTRAMUSCULAR | Status: DC
  Administered 2022-04-15 – 2022-04-19 (×7): 30 mg via SUBCUTANEOUS
  Filled 2022-04-13 (×8): qty 0.3

## 2022-04-13 MED ORDER — KETOROLAC TROMETHAMINE 15 MG/ML IJ SOLN
15.0000 mg | Freq: Three times a day (TID) | INTRAMUSCULAR | Status: AC
  Administered 2022-04-13 – 2022-04-18 (×15): 15 mg via INTRAVENOUS
  Filled 2022-04-13 (×15): qty 1

## 2022-04-13 MED ORDER — IOHEXOL 350 MG/ML SOLN
75.0000 mL | Freq: Once | INTRAVENOUS | Status: AC | PRN
  Administered 2022-04-13: 75 mL via INTRAVENOUS

## 2022-04-13 MED ORDER — SIMETHICONE 80 MG PO CHEW: 80.0000 mg | CHEWABLE_TABLET | Freq: Four times a day (QID) | ORAL | Status: DC | PRN

## 2022-04-13 MED ORDER — FENTANYL CITRATE PF 50 MCG/ML IJ SOSY
PREFILLED_SYRINGE | INTRAMUSCULAR | Status: AC
  Administered 2022-04-13: 50 ug via INTRAVENOUS
  Filled 2022-04-13: qty 1

## 2022-04-13 MED ORDER — ACETAMINOPHEN 325 MG PO TABS
650.0000 mg | ORAL_TABLET | Freq: Four times a day (QID) | ORAL | Status: DC
  Administered 2022-04-13 – 2022-04-19 (×21): 650 mg via ORAL
  Filled 2022-04-13 (×22): qty 2

## 2022-04-13 MED ORDER — HYDROMORPHONE HCL 1 MG/ML IJ SOLN
1.0000 mg | INTRAMUSCULAR | Status: DC | PRN
  Administered 2022-04-13 – 2022-04-19 (×3): 1 mg via INTRAVENOUS
  Filled 2022-04-13 (×3): qty 1

## 2022-04-13 MED ORDER — OXYCODONE HCL 5 MG PO TABS
5.0000 mg | ORAL_TABLET | ORAL | Status: DC | PRN
  Administered 2022-04-14 – 2022-04-15 (×3): 5 mg via ORAL
  Filled 2022-04-13 (×3): qty 1

## 2022-04-13 MED ORDER — OXYCODONE HCL 5 MG PO TABS
10.0000 mg | ORAL_TABLET | ORAL | Status: DC | PRN
  Administered 2022-04-13 – 2022-04-17 (×9): 10 mg via ORAL
  Filled 2022-04-13 (×10): qty 2

## 2022-04-13 MED ORDER — ORAL CARE MOUTH RINSE: 15.0000 mL | OROMUCOSAL | Status: DC | PRN

## 2022-04-13 MED ORDER — GABAPENTIN 600 MG PO TABS
300.0000 mg | ORAL_TABLET | Freq: Three times a day (TID) | ORAL | Status: DC
  Administered 2022-04-13: 300 mg via ORAL
  Filled 2022-04-13 (×4): qty 0.5

## 2022-04-13 MED ORDER — METHOCARBAMOL 1000 MG/10ML IJ SOLN
500.0000 mg | Freq: Four times a day (QID) | INTRAVENOUS | Status: DC | PRN
  Administered 2022-04-16: 500 mg via INTRAVENOUS
  Filled 2022-04-13: qty 500

## 2022-04-13 MED ORDER — FENTANYL CITRATE PF 50 MCG/ML IJ SOSY
50.0000 ug | PREFILLED_SYRINGE | Freq: Once | INTRAMUSCULAR | Status: AC
  Administered 2022-04-13: 50 ug via INTRAVENOUS
  Filled 2022-04-13: qty 1

## 2022-04-13 MED ORDER — DOCUSATE SODIUM 100 MG PO CAPS
100.0000 mg | ORAL_CAPSULE | Freq: Two times a day (BID) | ORAL | Status: DC
  Administered 2022-04-13 – 2022-04-18 (×9): 100 mg via ORAL
  Filled 2022-04-13 (×11): qty 1

## 2022-04-13 MED ORDER — LACTATED RINGERS IV SOLN: INTRAVENOUS | Status: DC

## 2022-04-13 MED ORDER — PROCHLORPERAZINE EDISYLATE 10 MG/2ML IJ SOLN
10.0000 mg | INTRAMUSCULAR | Status: DC | PRN
  Administered 2022-04-16: 10 mg via INTRAVENOUS
  Filled 2022-04-13: qty 2
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
## 2022-04-13 NOTE — ED Notes (Signed)
Patient transported to CT 

## 2022-04-13 NOTE — ED Notes (Signed)
Fast negative per MD ultrasound
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
## 2022-04-13 NOTE — ED Notes (Signed)
Pt back from CT
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
## 2022-04-13 NOTE — ED Notes (Signed)
Pt's belongings given to wife

## 2022-04-13 NOTE — ED Notes (Signed)
Pt placed on non re breather due to low O2 sats

## 2022-04-14 ENCOUNTER — Inpatient Hospital Stay (HOSPITAL_COMMUNITY): Payer: BC Managed Care – PPO

## 2022-04-14 ENCOUNTER — Encounter (HOSPITAL_COMMUNITY): Payer: Self-pay

## 2022-04-14 ENCOUNTER — Inpatient Hospital Stay (HOSPITAL_COMMUNITY): Payer: BC Managed Care – PPO | Admitting: Certified Registered Nurse Anesthetist

## 2022-04-14 LAB — MRSA NEXT GEN BY PCR, NASAL: MRSA by PCR Next Gen: NOT DETECTED

## 2022-04-14 LAB — LACTIC ACID, PLASMA: Lactic Acid, Venous: 2.2 mmol/L (ref 0.5–1.9)

## 2022-04-14 MED ORDER — PROPOFOL 10 MG/ML IV BOLUS
INTRAVENOUS | Status: AC
  Filled 2022-04-14: qty 20

## 2022-04-14 MED ORDER — AMISULPRIDE (ANTIEMETIC) 5 MG/2ML IV SOLN: 10.0000 mg | Freq: Once | INTRAVENOUS | Status: DC | PRN

## 2022-04-14 MED ORDER — ORAL CARE MOUTH RINSE: 15.0000 mL | Freq: Once | OROMUCOSAL | Status: DC

## 2022-04-14 MED ORDER — OXYCODONE HCL 5 MG/5ML PO SOLN: 5.0000 mg | Freq: Once | ORAL | Status: DC | PRN

## 2022-04-14 MED ORDER — ACETAMINOPHEN 10 MG/ML IV SOLN: 1000.0000 mg | Freq: Once | INTRAVENOUS | Status: DC | PRN

## 2022-04-14 MED ORDER — CHLORHEXIDINE GLUCONATE 0.12 % MT SOLN: 15.0000 mL | Freq: Once | OROMUCOSAL | Status: DC

## 2022-04-14 MED ORDER — TRANEXAMIC ACID-NACL 1000-0.7 MG/100ML-% IV SOLN
INTRAVENOUS | Status: AC
  Filled 2022-04-14: qty 100

## 2022-04-14 MED ORDER — CHLORHEXIDINE GLUCONATE 0.12 % MT SOLN
OROMUCOSAL | Status: AC
  Filled 2022-04-14: qty 15

## 2022-04-14 MED ORDER — LACTATED RINGERS IV SOLN: INTRAVENOUS | Status: DC

## 2022-04-14 MED ORDER — 0.9 % SODIUM CHLORIDE (POUR BTL) OPTIME: TOPICAL | Status: DC | PRN

## 2022-04-14 MED ORDER — CEFAZOLIN SODIUM-DEXTROSE 2-4 GM/100ML-% IV SOLN
INTRAVENOUS | Status: AC
  Filled 2022-04-14: qty 100

## 2022-04-14 MED ORDER — CEFAZOLIN SODIUM-DEXTROSE 2-4 GM/100ML-% IV SOLN: 2.0000 g | INTRAVENOUS | Status: DC

## 2022-04-14 MED ORDER — GABAPENTIN 300 MG PO CAPS
300.0000 mg | ORAL_CAPSULE | Freq: Three times a day (TID) | ORAL | Status: DC
  Administered 2022-04-14 – 2022-04-19 (×14): 300 mg via ORAL
  Filled 2022-04-14 (×14): qty 1

## 2022-04-14 MED ORDER — CHLORHEXIDINE GLUCONATE 4 % EX LIQD
60.0000 mL | Freq: Once | CUTANEOUS | Status: DC
  Filled 2022-04-14: qty 60

## 2022-04-14 MED ORDER — PROMETHAZINE HCL 25 MG/ML IJ SOLN: 6.2500 mg | INTRAMUSCULAR | Status: DC | PRN

## 2022-04-14 MED ORDER — ACETAMINOPHEN 160 MG/5ML PO SOLN: 325.0000 mg | ORAL | Status: DC | PRN

## 2022-04-14 MED ORDER — TRANEXAMIC ACID-NACL 1000-0.7 MG/100ML-% IV SOLN: 1000.0000 mg | INTRAVENOUS | Status: DC

## 2022-04-14 MED ORDER — ENSURE PRE-SURGERY PO LIQD
296.0000 mL | Freq: Once | ORAL | Status: AC
  Administered 2022-04-14: 296 mL via ORAL
  Filled 2022-04-14: qty 296

## 2022-04-14 MED ORDER — OXYCODONE HCL 5 MG PO TABS: 5.0000 mg | ORAL_TABLET | Freq: Once | ORAL | Status: DC | PRN

## 2022-04-14 MED ORDER — ACETAMINOPHEN 325 MG PO TABS: 325.0000 mg | ORAL_TABLET | ORAL | Status: DC | PRN

## 2022-04-14 MED ORDER — FENTANYL CITRATE (PF) 100 MCG/2ML IJ SOLN: 25.0000 ug | INTRAMUSCULAR | Status: DC | PRN

## 2022-04-14 MED ORDER — LACTATED RINGERS IV BOLUS: 500.0000 mL | Freq: Once | INTRAVENOUS | Status: DC

## 2022-04-14 NOTE — Progress Notes (Signed)
Patient off the unit to the OR. Wife at the bedside.
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
## 2022-04-14 NOTE — Progress Notes (Signed)
Patient returned to the unit. Surgery has been pushed back until tomorrow. V/O given for a diet and NPO after midnight. Patient is resting in bed with eyes closed no distress noted at this time. Well continue to monitor.

## 2022-04-14 NOTE — Anesthesia Preprocedure Evaluation (Signed)
Anesthesia Evaluation  Patient identified by MRN, date of birth, ID band Patient awake    Reviewed: Allergy & Precautions, NPO status , Patient's Chart, lab work & pertinent test results  Airway Mallampati: I   Neck ROM: Limited  Mouth opening: Limited Mouth Opening  Dental  (+) Teeth Intact, Dental Advisory Given   Pulmonary Patient abstained from smoking.   breath sounds clear to auscultation       Cardiovascular negative cardio ROS  Rhythm:Regular Rate:Normal     Neuro/Psych negative neurological ROS  negative psych ROS   GI/Hepatic negative GI ROS, Neg liver ROS,,,  Endo/Other  Hypothyroidism    Renal/GU negative Renal ROS     Musculoskeletal negative musculoskeletal ROS (+)    Abdominal   Peds  Hematology negative hematology ROS (+)   Anesthesia Other Findings   Reproductive/Obstetrics                             Anesthesia Physical Anesthesia Plan  ASA: 2  Anesthesia Plan: General   Post-op Pain Management: Tylenol PO (pre-op)*, Celebrex PO (pre-op)* and Gabapentin PO (pre-op)*   Induction: Intravenous, Rapid sequence and Cricoid pressure planned  PONV Risk Score and Plan: 3 and Ondansetron, Dexamethasone and Midazolam  Airway Management Planned: Oral ETT and Video Laryngoscope Planned  Additional Equipment: None  Intra-op Plan:   Post-operative Plan: Extubation in OR  Informed Consent: I have reviewed the patients History and Physical, chart, labs and discussed the procedure including the risks, benefits and alternatives for the proposed anesthesia with the patient or authorized representative who has indicated his/her understanding and acceptance.     Dental advisory given  Plan Discussed with: CRNA  Anesthesia Plan Comments:        Anesthesia Quick Evaluation

## 2022-04-14 NOTE — Progress Notes (Signed)
Follow up - Trauma Critical Care  Patient Details:    Alan Watkins is an 37 y.o. male.   Lines/tubes : Urethral Catheter Alan Watkins NT Latex 16 Fr. (Active)  Indication for Insertion or Continuance of Catheter Acute urinary retention (I&O Cath for 24 hrs prior to catheter insertion- Inpatient Only);Unstable spinal/crush injuries / Multisystem Trauma 04/14/22 0724  Site Assessment Clean, Dry, Intact 04/14/22 0725  Catheter Maintenance Bag below level of bladder;Catheter secured;Drainage bag/tubing not touching floor;No dependent loops 04/14/22 0725  Collection Container Standard drainage bag 04/14/22 0725  Securement Method Securing device (Describe) 04/14/22 0725  Urinary Catheter Interventions (if applicable) Unclamped 19/62/22 0725  Output (mL) 75 mL 04/14/22 0847    Microbiology/Sepsis markers: Results for orders placed or performed during the hospital encounter of 04/13/22  MRSA Next Gen by PCR, Nasal     Status: None   Collection Time: 04/13/22 10:54 PM   Specimen: Nasal Mucosa; Nasal Swab  Result Value Ref Range Status   MRSA by PCR Next Gen NOT DETECTED NOT DETECTED Final    Comment: (NOTE) The GeneXpert MRSA Assay (FDA approved for NASAL specimens only), is one component of a comprehensive MRSA colonization surveillance program. It is not intended to diagnose MRSA infection nor to guide or monitor treatment for MRSA infections. Test performance is not FDA approved in patients less than 78 years old. Performed at [facility], [place] [address] [place], [place] [address]     Anti-infectives:  Anti-infectives (From admission, onward)    Start     Dose/Rate Route Frequency Ordered Stop   04/14/22 0840  ceFAZolin (ANCEF) 2-4 GM/100ML-% IVPB       Note to Pharmacy: Bobbie Stack M: cabinet override      04/14/22 0840 04/14/22 2044   04/14/22 0600  ceFAZolin (ANCEF) IVPB 2g/100 mL premix        2 g 200 mL/hr over 30 Minutes Intravenous On call to O.R. 04/14/22 0146  04/15/22 0559       Consults:     Studies:    Events:  Subjective:    Overnight Issues: No events reported overnight; reports he is ready for surgery today with ortho. Denies any complaints at present. Wife at bedside  Objective:  Vital signs for last 24 hours: Temp:  [96.6 F (35.9 C)-99.6 F (37.6 C)] 98.8 F (37.1 C) (02/04 0800) Pulse Rate:  [76-121] 83 (02/04 0800) Resp:  [11-31] 18 (02/04 0800) BP: (110-142)/(60-105) 113/64 (02/04 0800) SpO2:  [92 %-100 %] 99 % (02/04 0800) Weight:  [77.6 kg-79.4 kg] 77.6 kg (02/03 2300)  Hemodynamic parameters for last 24 hours:    Intake/Output from previous day: 02/03 0701 - 02/04 0700 In: 1014.1 [P.O.:240; I.V.:774.1] Out: 750 [Urine:750]  Intake/Output this shift: Total I/O In: 97.3 [I.V.:97.3] Out: 75 [Urine:75]  Vent settings for last 24 hours:    Physical Exam:  General: alert and no respiratory distress Neuro: alert, oriented, and nonfocal exam CVS: rrr GI: soft, nontender, nondistended  Results for orders placed or performed during the hospital encounter of 04/13/22 (from the past 24 hour(s))  Sample to Blood Bank     Status: None   Collection Time: 04/13/22  8:20 PM  Result Value Ref Range   Blood Bank Specimen SAMPLE AVAILABLE FOR TESTING    Sample Expiration      04/14/2022,2359 Performed at [facility], [place] [address] [place], [place] [address]   Comprehensive metabolic panel     Status: Abnormal  Collection Time: 04/13/22  8:25 PM  Result Value Ref Range   Sodium 137 135 - 145 mmol/L   Potassium 3.1 (L) 3.5 - 5.1 mmol/L   Chloride 102 98 - 111 mmol/L   CO2 22 22 - 32 mmol/L   Glucose, Bld 163 (H) 70 - 99 mg/dL   BUN 10 6 - 20 mg/dL   Creatinine, Ser 2.72 0.61 - 1.24 mg/dL   Calcium 8.7 (L) 8.9 - 10.3 mg/dL   Total Protein 7.0 6.5 - 8.1 g/dL   Albumin 3.7 3.5 - 5.0 g/dL   AST 61 (H) 15 - 41 U/L   ALT 41 0 - 44 U/L   Alkaline Phosphatase 72 38 - 126 U/L   Total Bilirubin 0.3 0.3  - 1.2 mg/dL   GFR, Estimated >53 >66 mL/min   Anion gap 13 5 - 15  CBC     Status: None   Collection Time: 04/13/22  8:25 PM  Result Value Ref Range   WBC 10.4 4.0 - 10.5 K/uL   RBC 4.66 4.22 - 5.81 MIL/uL   Hemoglobin 13.3 13.0 - 17.0 g/dL   HCT 44.0 34.7 - 42.5 %   MCV 88.0 80.0 - 100.0 fL   MCH 28.5 26.0 - 34.0 pg   MCHC 32.4 30.0 - 36.0 g/dL   RDW 95.6 38.7 - 56.4 %   Platelets 273 150 - 400 K/uL   nRBC 0.0 0.0 - 0.2 %  Ethanol     Status: Abnormal   Collection Time: 04/13/22  8:25 PM  Result Value Ref Range   Alcohol, Ethyl (B) 28 (H) <10 mg/dL  Lactic acid, plasma     Status: Abnormal   Collection Time: 04/13/22  8:25 PM  Result Value Ref Range   Lactic Acid, Venous 3.7 (HH) 0.5 - 1.9 mmol/L  Protime-INR     Status: None   Collection Time: 04/13/22  8:25 PM  Result Value Ref Range   Prothrombin Time 13.5 11.4 - 15.2 seconds   INR 1.0 0.8 - 1.2  I-stat chem 8, ed     Status: Abnormal   Collection Time: 04/13/22  8:30 PM  Result Value Ref Range   Sodium 140 135 - 145 mmol/L   Potassium 3.2 (L) 3.5 - 5.1 mmol/L   Chloride 103 98 - 111 mmol/L   BUN 10 6 - 20 mg/dL   Creatinine, Ser 3.32 0.61 - 1.24 mg/dL   Glucose, Bld 951 (H) 70 - 99 mg/dL   Calcium, Ion 8.84 (L) 1.15 - 1.40 mmol/L   TCO2 22 22 - 32 mmol/L   Hemoglobin 13.9 13.0 - 17.0 g/dL   HCT 16.6 06.3 - 01.6 %  Urinalysis, Routine w reflex microscopic -Urine, Clean Catch     Status: Abnormal   Collection Time: 04/13/22 10:03 PM  Result Value Ref Range   Color, Urine YELLOW YELLOW   APPearance CLEAR CLEAR   Specific Gravity, Urine 1.033 (H) 1.005 - 1.030   pH 5.0 5.0 - 8.0   Glucose, UA NEGATIVE NEGATIVE mg/dL   Hgb urine dipstick LARGE (A) NEGATIVE   Bilirubin Urine NEGATIVE NEGATIVE   Ketones, ur NEGATIVE NEGATIVE mg/dL   Protein, ur 010 (A) NEGATIVE mg/dL   Nitrite NEGATIVE NEGATIVE   Leukocytes,Ua NEGATIVE NEGATIVE   RBC / HPF >50 0 - 5 RBC/hpf   WBC, UA 6-10 0 - 5 WBC/hpf   Bacteria, UA NONE SEEN  NONE SEEN   Squamous Epithelial / HPF 0-5 0 - 5 /HPF  Mucus PRESENT   Troponin I (High Sensitivity)     Status: Abnormal   Collection Time: 04/13/22 10:03 PM  Result Value Ref Range   Troponin I (High Sensitivity) 22 (H) <18 ng/L  HIV Antibody (routine testing w rflx)     Status: None   Collection Time: 04/13/22 10:03 PM  Result Value Ref Range   HIV Screen 4th Generation wRfx Non Reactive Non Reactive  CBC     Status: Abnormal   Collection Time: 04/13/22 10:03 PM  Result Value Ref Range   WBC 16.4 (H) 4.0 - 10.5 K/uL   RBC 4.09 (L) 4.22 - 5.81 MIL/uL   Hemoglobin 11.8 (L) 13.0 - 17.0 g/dL   HCT 36.0 (L) 39.0 - 52.0 %   MCV 88.0 80.0 - 100.0 fL   MCH 28.9 26.0 - 34.0 pg   MCHC 32.8 30.0 - 36.0 g/dL   RDW 13.0 11.5 - 15.5 %   Platelets 236 150 - 400 K/uL   nRBC 0.0 0.0 - 0.2 %  Creatinine, serum     Status: None   Collection Time: 04/13/22 10:03 PM  Result Value Ref Range   Creatinine, Ser 1.00 0.61 - 1.24 mg/dL   GFR, Estimated >60 >60 mL/min  MRSA Next Gen by PCR, Nasal     Status: None   Collection Time: 04/13/22 10:54 PM   Specimen: Nasal Mucosa; Nasal Swab  Result Value Ref Range   MRSA by PCR Next Gen NOT DETECTED NOT DETECTED  Basic metabolic panel     Status: Abnormal   Collection Time: 04/13/22 11:28 PM  Result Value Ref Range   Sodium 138 135 - 145 mmol/L   Potassium 3.5 3.5 - 5.1 mmol/L   Chloride 102 98 - 111 mmol/L   CO2 25 22 - 32 mmol/L   Glucose, Bld 104 (H) 70 - 99 mg/dL   BUN 10 6 - 20 mg/dL   Creatinine, Ser 0.99 0.61 - 1.24 mg/dL   Calcium 8.5 (L) 8.9 - 10.3 mg/dL   GFR, Estimated >60 >60 mL/min   Anion gap 11 5 - 15  CBC     Status: Abnormal   Collection Time: 04/13/22 11:28 PM  Result Value Ref Range   WBC 11.4 (H) 4.0 - 10.5 K/uL   RBC 4.38 4.22 - 5.81 MIL/uL   Hemoglobin 13.1 13.0 - 17.0 g/dL   HCT 37.8 (L) 39.0 - 52.0 %   MCV 86.3 80.0 - 100.0 fL   MCH 29.9 26.0 - 34.0 pg   MCHC 34.7 30.0 - 36.0 g/dL   RDW 13.1 11.5 - 15.5 %    Platelets 208 150 - 400 K/uL   nRBC 0.0 0.0 - 0.2 %  Lactic acid, plasma     Status: Abnormal   Collection Time: 04/13/22 11:28 PM  Result Value Ref Range   Lactic Acid, Venous 2.2 (HH) 0.5 - 1.9 mmol/L    Assessment & Plan: Present on Admission:  Hemopneumothorax on left   C7 rib fx Small L hptx; L pneumatocele up to 6 cm; L pulm ctx; CXR today shows small left apical ptx; repeat CXR after surgery with Ortho G2 splenic injury - monitor hgb, bedrest today as he is going to OR; holding chemical dvt ppx at present ?Small L hemidiaphragm injury "not excluded" - subtle findings; no clear hernia on his CT L rib fxs 2 & 3 - multimodal pain control Bilateral scapular fxs - as per ortho L tib/fib  - per orthopedics Lactic acidosis -  Lactate 2.2 - addn'l 500 cc bolus this morning  Ppx: SCDs   LOS: 1 day   Additional comments:I reviewed the patient's new clinical lab test results. Cbc, BMP and I reviewed the patients new imaging test results. CXR  Critical Care Total Time*: [address] [place], MD [facility]  04/14/2022  *Care during the described time interval was provided by me. I have reviewed this patient's available data, including medical history, events of note, physical examination and test results as part of my evaluation.
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
## 2022-04-14 NOTE — Progress Notes (Signed)
Called wife to inform her the patients surgery has been pushed until tomorrow. Wife was still sitting in the waiting area and had not been informed of the change by the OR staff.
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
## 2022-04-15 ENCOUNTER — Encounter (HOSPITAL_COMMUNITY): Admission: EM | Disposition: A | Discharge status: Home / Self Care | Payer: Self-pay | Source: Home / Self Care

## 2022-04-15 ENCOUNTER — Inpatient Hospital Stay (HOSPITAL_COMMUNITY): Payer: BC Managed Care – PPO

## 2022-04-15 ENCOUNTER — Inpatient Hospital Stay (HOSPITAL_COMMUNITY): Payer: BC Managed Care – PPO | Admitting: Anesthesiology

## 2022-04-15 ENCOUNTER — Encounter (HOSPITAL_COMMUNITY): Payer: Self-pay

## 2022-04-15 ENCOUNTER — Other Ambulatory Visit: Payer: Self-pay

## 2022-04-15 DIAGNOSIS — J942 Hemothorax: Secondary | ICD-10-CM

## 2022-04-15 DIAGNOSIS — S82252A Displaced comminuted fracture of shaft of left tibia, initial encounter for closed fracture: Secondary | ICD-10-CM

## 2022-04-15 HISTORY — PX: TIBIA IM NAIL INSERTION: SHX2516

## 2022-04-15 LAB — BASIC METABOLIC PANEL
Anion gap: 6 (ref 5–15)
BUN: 7 mg/dL (ref 6–20)
CO2: 28 mmol/L (ref 22–32)
Calcium: 8.2 mg/dL — ABNORMAL LOW (ref 8.9–10.3)
Chloride: 101 mmol/L (ref 98–111)
Creatinine, Ser: 1.03 mg/dL (ref 0.61–1.24)
GFR, Estimated: >60 mL/min (ref >60)
Glucose, Bld: 86 mg/dL (ref 70–99)
Potassium: 3.4 mmol/L — ABNORMAL LOW (ref 3.5–5.1)
Sodium: 135 mmol/L (ref 135–145)

## 2022-04-15 LAB — CBC
HCT: 28.7 % — ABNORMAL LOW (ref 39.0–52.0)
HCT: 24.8 % — ABNORMAL LOW (ref 39.0–52.0)
Hemoglobin: 9.8 g/dL — ABNORMAL LOW (ref 13.0–17.0)
Hemoglobin: 8.6 g/dL — ABNORMAL LOW (ref 13.0–17.0)
MCH: 29.3 pg (ref 26.0–34.0)
MCH: 29.6 pg (ref 26.0–34.0)
MCHC: 34.1 g/dL (ref 30.0–36.0)
MCHC: 34.7 g/dL (ref 30.0–36.0)
MCV: 85.9 fL (ref 80.0–100.0)
MCV: 85.2 fL (ref 80.0–100.0)
Platelets: 153 10*3/uL (ref 150–400)
Platelets: 153 10*3/uL (ref 150–400)
RBC: 3.34 MIL/uL — ABNORMAL LOW (ref 4.22–5.81)
RBC: 2.91 MIL/uL — ABNORMAL LOW (ref 4.22–5.81)
RDW: 13.2 % (ref 11.5–15.5)
RDW: 13 % (ref 11.5–15.5)
WBC: 4.8 10*3/uL (ref 4.0–10.5)
WBC: 5.5 10*3/uL (ref 4.0–10.5)
nRBC: 0 % (ref 0.0–0.2)
nRBC: 0 % (ref 0.0–0.2)

## 2022-04-15 SURGERY — INSERTION, INTRAMEDULLARY ROD, TIBIA
Anesthesia: General | Laterality: Left

## 2022-04-15 MED ORDER — HYDROMORPHONE HCL 1 MG/ML IJ SOLN: 0.2500 mg | INTRAMUSCULAR | Status: DC | PRN

## 2022-04-15 MED ORDER — LIDOCAINE 2% (20 MG/ML) 5 ML SYRINGE
INTRAMUSCULAR | Status: DC | PRN
  Administered 2022-04-15: 60 mg via INTRAVENOUS

## 2022-04-15 MED ORDER — ENOXAPARIN SODIUM 30 MG/0.3ML IJ SOSY: 30.0000 mg | PREFILLED_SYRINGE | Freq: Two times a day (BID) | INTRAMUSCULAR | Status: DC

## 2022-04-15 MED ORDER — FENTANYL CITRATE (PF) 250 MCG/5ML IJ SOLN
INTRAMUSCULAR | Status: DC | PRN
  Administered 2022-04-15: 100 ug via INTRAVENOUS
  Administered 2022-04-15: 50 ug via INTRAVENOUS
  Administered 2022-04-15: 100 ug via INTRAVENOUS

## 2022-04-15 MED ORDER — CEFAZOLIN SODIUM-DEXTROSE 2-4 GM/100ML-% IV SOLN
2.0000 g | Freq: Three times a day (TID) | INTRAVENOUS | Status: AC
  Administered 2022-04-15 (×2): 2 g via INTRAVENOUS
  Filled 2022-04-15 (×2): qty 100

## 2022-04-15 MED ORDER — PROPOFOL 10 MG/ML IV BOLUS
INTRAVENOUS | Status: AC
  Filled 2022-04-15: qty 20

## 2022-04-15 MED ORDER — CEFAZOLIN SODIUM 1 G IJ SOLR
INTRAMUSCULAR | Status: AC
  Filled 2022-04-15: qty 20

## 2022-04-15 MED ORDER — ONDANSETRON HCL 4 MG/2ML IJ SOLN
INTRAMUSCULAR | Status: AC
  Filled 2022-04-15: qty 2

## 2022-04-15 MED ORDER — CHLORHEXIDINE GLUCONATE 0.12 % MT SOLN
15.0000 mL | Freq: Once | OROMUCOSAL | Status: DC
  Filled 2022-04-15: qty 15

## 2022-04-15 MED ORDER — DEXAMETHASONE SODIUM PHOSPHATE 10 MG/ML IJ SOLN
INTRAMUSCULAR | Status: AC
  Filled 2022-04-15: qty 1

## 2022-04-15 MED ORDER — MIDAZOLAM HCL 2 MG/2ML IJ SOLN
INTRAMUSCULAR | Status: DC | PRN
  Administered 2022-04-15: 2 mg via INTRAVENOUS

## 2022-04-15 MED ORDER — PHENYLEPHRINE 80 MCG/ML (10ML) SYRINGE FOR IV PUSH (FOR BLOOD PRESSURE SUPPORT)
PREFILLED_SYRINGE | INTRAVENOUS | Status: DC | PRN
  Administered 2022-04-15: 160 ug via INTRAVENOUS

## 2022-04-15 MED ORDER — CEFAZOLIN SODIUM-DEXTROSE 2-3 GM-%(50ML) IV SOLR
INTRAVENOUS | Status: DC | PRN
  Administered 2022-04-15: 2 g via INTRAVENOUS

## 2022-04-15 MED ORDER — STERILE WATER FOR IRRIGATION IR SOLN
Status: DC | PRN
  Administered 2022-04-15: 1000 mL

## 2022-04-15 MED ORDER — ACETAMINOPHEN 10 MG/ML IV SOLN
INTRAVENOUS | Status: AC
  Filled 2022-04-15: qty 100

## 2022-04-15 MED ORDER — TRANEXAMIC ACID-NACL 1000-0.7 MG/100ML-% IV SOLN: 1000.0000 mg | Freq: Once | INTRAVENOUS | Status: DC

## 2022-04-15 MED ORDER — LACTATED RINGERS IV SOLN: INTRAVENOUS | Status: DC

## 2022-04-15 MED ORDER — ONDANSETRON HCL 4 MG/2ML IJ SOLN
INTRAMUSCULAR | Status: DC | PRN
  Administered 2022-04-15: 4 mg via INTRAVENOUS

## 2022-04-15 MED ORDER — ACETAMINOPHEN 10 MG/ML IV SOLN
INTRAVENOUS | Status: DC | PRN
  Administered 2022-04-15: 1000 mg via INTRAVENOUS

## 2022-04-15 MED ORDER — FENTANYL CITRATE (PF) 250 MCG/5ML IJ SOLN
INTRAMUSCULAR | Status: AC
  Filled 2022-04-15: qty 5

## 2022-04-15 MED ORDER — ROCURONIUM BROMIDE 10 MG/ML (PF) SYRINGE
PREFILLED_SYRINGE | INTRAVENOUS | Status: DC | PRN
  Administered 2022-04-15: 20 mg via INTRAVENOUS
  Administered 2022-04-15: 70 mg via INTRAVENOUS

## 2022-04-15 MED ORDER — MIDAZOLAM HCL 2 MG/2ML IJ SOLN
INTRAMUSCULAR | Status: AC
  Filled 2022-04-15: qty 2

## 2022-04-15 MED ORDER — DEXAMETHASONE SODIUM PHOSPHATE 10 MG/ML IJ SOLN
INTRAMUSCULAR | Status: DC | PRN
  Administered 2022-04-15: 10 mg via INTRAVENOUS

## 2022-04-15 MED ORDER — 0.9 % SODIUM CHLORIDE (POUR BTL) OPTIME
TOPICAL | Status: DC | PRN
  Administered 2022-04-15: 1000 mL

## 2022-04-15 MED ORDER — PROPOFOL 10 MG/ML IV BOLUS
INTRAVENOUS | Status: DC | PRN
  Administered 2022-04-15: 150 mg via INTRAVENOUS

## 2022-04-15 MED ORDER — POTASSIUM CHLORIDE 10 MEQ/100ML IV SOLN: 10.0000 meq | INTRAVENOUS | Status: AC

## 2022-04-15 MED ORDER — ORAL CARE MOUTH RINSE: 15.0000 mL | Freq: Once | OROMUCOSAL | Status: DC

## 2022-04-15 MED ORDER — LIDOCAINE 2% (20 MG/ML) 5 ML SYRINGE
INTRAMUSCULAR | Status: AC
  Filled 2022-04-15: qty 5

## 2022-04-15 MED ORDER — ROCURONIUM BROMIDE 10 MG/ML (PF) SYRINGE
PREFILLED_SYRINGE | INTRAVENOUS | Status: AC
  Filled 2022-04-15: qty 10

## 2022-04-15 MED ORDER — SUGAMMADEX SODIUM 200 MG/2ML IV SOLN
INTRAVENOUS | Status: DC | PRN
  Administered 2022-04-15: 200 mg via INTRAVENOUS

## 2022-04-15 SURGICAL SUPPLY — 56 items
APL PRP STRL LF DISP 70% ISPRP (MISCELLANEOUS) ×1
BAG COUNTER SPONGE SURGICOUNT (BAG) ×1 IMPLANT
BAG SPNG CNTER NS LX DISP (BAG) ×1
BIT DRILL 4.3 CALIBRATED (DRILL) IMPLANT
BIT DRILL FREE TARG 3.3MM (DRILL) IMPLANT
BLADE SURG 10 STRL SS (BLADE) ×2 IMPLANT
BNDG COHESIVE 4X5 TAN STRL (GAUZE/BANDAGES/DRESSINGS) ×1 IMPLANT
BNDG ELASTIC 6X5.8 VLCR STR LF (GAUZE/BANDAGES/DRESSINGS) IMPLANT
BNDG GAUZE DERMACEA FLUFF 4 (GAUZE/BANDAGES/DRESSINGS) IMPLANT
BNDG GZE DERMACEA 4 6PLY (GAUZE/BANDAGES/DRESSINGS) ×2
BRUSH SCRUB EZ PLAIN DRY (MISCELLANEOUS) ×2 IMPLANT
CHLORAPREP W/TINT 26 (MISCELLANEOUS) ×1 IMPLANT
COVER SURGICAL LIGHT HANDLE (MISCELLANEOUS) ×2 IMPLANT
DRAPE C-ARM 42X72 X-RAY (DRAPES) ×1 IMPLANT
DRAPE C-ARMOR (DRAPES) ×1 IMPLANT
DRAPE HALF SHEET 40X57 (DRAPES) ×2 IMPLANT
DRAPE IMP U-DRAPE 54X76 (DRAPES) ×2 IMPLANT
DRAPE INCISE IOBAN 66X45 STRL (DRAPES) IMPLANT
DRAPE ORTHO SPLIT 77X108 STRL (DRAPES) ×2
DRAPE SURG ORHT 6 SPLT 77X108 (DRAPES) ×2 IMPLANT
DRAPE U-SHAPE 47X51 STRL (DRAPES) ×1 IMPLANT
DRILL 4.3 CALIBRATED (DRILL) ×1
DRILL FREE TARG 3.3MM (DRILL) ×1
DRSG ADAPTIC 3X8 NADH LF (GAUZE/BANDAGES/DRESSINGS) ×1 IMPLANT
ELECT REM PT RETURN 9FT ADLT (ELECTROSURGICAL) ×1
ELECTRODE REM PT RTRN 9FT ADLT (ELECTROSURGICAL) ×1 IMPLANT
GAUZE SPONGE 4X4 12PLY STRL (GAUZE/BANDAGES/DRESSINGS) ×1 IMPLANT
GAUZE XEROFORM 5X9 LF (GAUZE/BANDAGES/DRESSINGS) IMPLANT
GLOVE BIO SURGEON STRL SZ 6.5 (GLOVE) ×3 IMPLANT
GLOVE BIO SURGEON STRL SZ7.5 (GLOVE) ×4 IMPLANT
GLOVE BIOGEL PI IND STRL 6.5 (GLOVE) ×1 IMPLANT
GLOVE BIOGEL PI IND STRL 7.5 (GLOVE) ×1 IMPLANT
GOWN STRL REUS W/ TWL LRG LVL3 (GOWN DISPOSABLE) ×2 IMPLANT
GOWN STRL REUS W/TWL LRG LVL3 (GOWN DISPOSABLE) ×2
GUIDEPIN VERSANAIL DSP 3.2X444 (ORTHOPEDIC DISPOSABLE SUPPLIES) IMPLANT
GUIDEWIRE NATURAL NAIL 3X100 (WIRE) IMPLANT
KIT BASIN OR (CUSTOM PROCEDURE TRAY) ×1 IMPLANT
KIT TURNOVER KIT B (KITS) ×1 IMPLANT
NAIL IM TIB NN 8.3X360 (Nail) IMPLANT
PACK TOTAL JOINT (CUSTOM PROCEDURE TRAY) ×1 IMPLANT
PAD ARMBOARD 7.5X6 YLW CONV (MISCELLANEOUS) ×2 IMPLANT
REAMER HEAD TAPER 12.0 (ORTHOPEDIC DISPOSABLE SUPPLIES) IMPLANT
SCREW BONE 5.0X37.5MM CORT Z (Screw) IMPLANT
SCREW CORT FT 32.5X4X3.5XST (Screw) IMPLANT
SCREW CORT FT ST 5X4.7 (Screw) IMPLANT
SCREW CORT IM NAIL 4X30 (Screw) IMPLANT
SCREW CORT IM NAIL 4X32.5 (Screw) ×1 IMPLANT
SCREW CORT IM NAIL 4X37.5 (Screw) IMPLANT
SLEEVE SURGEON STRL (DRAPES) IMPLANT
SUT VIC AB 0 CT1 27 (SUTURE) ×1
SUT VIC AB 0 CT1 27XBRD ANBCTR (SUTURE) IMPLANT
SUT VIC AB 2-0 CT1 27 (SUTURE) ×1
SUT VIC AB 2-0 CT1 TAPERPNT 27 (SUTURE) IMPLANT
TOWEL GREEN STERILE (TOWEL DISPOSABLE) ×2 IMPLANT
TOWEL GREEN STERILE FF (TOWEL DISPOSABLE) ×1 IMPLANT
YANKAUER SUCT BULB TIP NO VENT (SUCTIONS) IMPLANT

## 2022-04-15 NOTE — Anesthesia Procedure Notes (Signed)
Procedure Name: Intubation Date/Time: 04/15/2022 9:14 AM  Performed by: Mosetta Pigeon, CRNAPre-anesthesia Checklist: Patient identified, Emergency Drugs available, Suction available and Patient being monitored Patient Re-evaluated:Patient Re-evaluated prior to induction Oxygen Delivery Method: Circle System Utilized Preoxygenation: Pre-oxygenation with 100% oxygen Induction Type: IV induction Ventilation: Mask ventilation without difficulty Laryngoscope Size: Mac and 4 Grade View: Grade I Tube type: Oral Tube size: 7.5 mm Number of attempts: 1 Airway Equipment and Method: Stylet Placement Confirmation: ETT inserted through vocal cords under direct vision, positive ETCO2 and breath sounds checked- equal and bilateral Secured at: 23 cm Tube secured with: Tape Dental Injury: Teeth and Oropharynx as per pre-operative assessment

## 2022-04-15 NOTE — Anesthesia Preprocedure Evaluation (Addendum)
Anesthesia Evaluation  Patient identified by MRN, date of birth, ID band Patient awake    Reviewed: Allergy & Precautions, H&P , NPO status , Patient's Chart, lab work & pertinent test results  Airway Mallampati: II  TM Distance: >3 FB Neck ROM: Full    Dental no notable dental hx. (+) Teeth Intact, Dental Advisory Given   Pulmonary neg pulmonary ROS, Patient abstained from smoking.   Pulmonary exam normal breath sounds clear to auscultation       Cardiovascular negative cardio ROS  Rhythm:Regular Rate:Normal     Neuro/Psych negative neurological ROS  negative psych ROS   GI/Hepatic negative GI ROS, Neg liver ROS,,,  Endo/Other  Hypothyroidism    Renal/GU negative Renal ROS  negative genitourinary   Musculoskeletal   Abdominal   Peds  Hematology negative hematology ROS (+)   Anesthesia Other Findings   Reproductive/Obstetrics negative OB ROS                             Anesthesia Physical Anesthesia Plan  ASA: 2  Anesthesia Plan: General   Post-op Pain Management: Ofirmev IV (intra-op)* and Toradol IV (intra-op)*   Induction: Intravenous  PONV Risk Score and Plan: 3 and Ondansetron, Dexamethasone and Midazolam  Airway Management Planned: Oral ETT  Additional Equipment:   Intra-op Plan:   Post-operative Plan: Extubation in OR  Informed Consent: I have reviewed the patients History and Physical, chart, labs and discussed the procedure including the risks, benefits and alternatives for the proposed anesthesia with the patient or authorized representative who has indicated his/her understanding and acceptance.     Dental advisory given  Plan Discussed with: CRNA  Anesthesia Plan Comments:        Anesthesia Quick Evaluation

## 2022-04-15 NOTE — Progress Notes (Signed)
PT Cancellation Note  Patient Details Name: Alan Watkins MRN: [id number] DOB: [birth year]   Cancelled Treatment:    Reason Eval/Treat Not Completed: Pain limiting ability to participate. Pt recently returned to floor from surgery. RN reports current goal is to manage pain. PT will attempt to follow up at a later time.   Zenaida Niece 04/15/2022, 2:08 PM

## 2022-04-15 NOTE — Brief Op Note (Signed)
   Brief Op Note  Date of Surgery: 04/15/2022  Preoperative Diagnosis: left Tiba fx  Postoperative Diagnosis: same  Procedure: Procedure(s): INTRAMEDULLARY (IM) NAIL TIBIAL  Implants: Implant Name Type Inv. Item Serial No. Manufacturer Lot No. LRB No. Used Action  NAIL IM TIB NN 8.3X360 - [id number] Nail NAIL IM TIB NN 8.3X360  [employer](ORTH,TRAU,BIO,SG) [id number] Left 1 Implanted  SCREW CORT IM NAIL 4X32.5 - [id number] Screw SCREW CORT IM NAIL 4X32.5  [employer](ORTH,TRAU,BIO,SG) [id number] Left 1 Implanted  SCREW CORT IM NAIL 4X30 - [id number] Screw SCREW CORT IM NAIL 4X30  [employer](ORTH,TRAU,BIO,SG) [id number] Left 1 Implanted  SCREW CORT IM NAIL 4X37.5 - [id number] Screw SCREW CORT IM NAIL 4X37.5  [employer](ORTH,TRAU,BIO,SG) [id number] Left 1 Implanted  SCREW BONE 5.0X37.5MM CORT Z - [id number] Screw SCREW BONE 5.0X37.5MM CORT Z  ZIM[employer](ORTH,TRAU,BIO,SG) [id number] Left 1 Implanted  SCREW CORT FT ST 5X4.7 - [id number] Screw SCREW CORT FT ST 5X4.7  ZIM[employer](ORTH,TRAU,BIO,SG) [id number] Left 1 Implanted    Surgeons: Surgeon(s): Vanetta Mulders, MD  Anesthesia: General    Estimated Blood Loss: See anesthesia record  Complications: None  Condition to PACU: Stable  Yevonne Pax, MD 04/15/2022 11:18 AM

## 2022-04-15 NOTE — Discharge Instructions (Signed)
     Discharge Instructions    Attending Surgeon: Vanetta Mulders, MD Office Phone Number: [phone]   Diagnosis and Procedures:    Surgeries Performed: Left tibia nail.  Discharge Plan:    Activity:  Weight bearing as tolerate left leg.  GENERAL INSTRUCTIONS: 1.  Please apply ice to your wound to help with swelling and inflammation. This will improve your comfort and your overall recovery following surgery.     2. Please call Dr. Eddie Dibbles office at [phone] with questions Monday-Friday during business hours. If no one answers, please leave a message and someone should get back to the patient within 24 hours. For emergencies please call 911 or proceed to the emergency room.   3. Patient to notify surgical team if experiences any of the following: Bowel/Bladder dysfunction, uncontrolled pain, nerve/muscle weakness, incision with increased drainage or redness, nausea/vomiting and Fever greater than 101.0 F.  Be alert for signs of infection including redness, streaking, odor, fever or chills. Be alert for excessive pain or bleeding and notify your surgeon immediately.  WOUND INSTRUCTIONS:   Leave your dressing, cast, or splint in place until your post operative visit.  Keep it clean and dry.  Always keep the incision clean and dry until the staples/sutures are removed. If there is no drainage from the incision you should keep it open to air. If there is drainage from the incision you must keep it covered at all times until the drainage stops  Do not soak in a bath tub, hot tub, pool, lake or other body of water until 21 days after your surgery and your incision is completely dry and healed.  If you have removable sutures (or staples) they must be removed 10-14 days (unless otherwise instructed) from the day of your surgery.     1)  Elevate the extremity as much as possible.  2)  Keep the dressing clean and dry.  3)  Please call us if the dressing becomes wet or dirty.  4)   If you are experiencing worsening pain or worsening swelling, please call.
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
## 2022-04-15 NOTE — Anesthesia Postprocedure Evaluation (Signed)
Anesthesia Post Note  Patient: Merick Kelleher Schedler  Procedure(s) Performed: INTRAMEDULLARY (IM) NAIL TIBIAL (Left)     Patient location during evaluation: PACU Anesthesia Type: General Level of consciousness: awake and alert Pain management: pain level controlled Vital Signs Assessment: post-procedure vital signs reviewed and stable Respiratory status: spontaneous breathing, nonlabored ventilation and respiratory function stable Cardiovascular status: blood pressure returned to baseline and stable Postop Assessment: no apparent nausea or vomiting Anesthetic complications: no  No notable events documented.  Last Vitals:  Vitals:   04/15/22 1130 04/15/22 1145  BP: 135/81 134/68  Pulse: (!) 103 99  Resp: (!) 23 15  Temp:  36.8 C  SpO2: 100% 100%    Last Pain:  Vitals:   04/15/22 1145  TempSrc:   PainSc: 0-No pain    LLE Motor Response: Purposeful movement;Responds to commands (04/15/22 1145) LLE Sensation: Full sensation (04/15/22 1145)          Amerie Beaumont,W. EDMOND
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
## 2022-04-15 NOTE — Progress Notes (Signed)
Patient returned form OR. Wife is at this bedside, voiced that the doctor updated on all surgical events. Patient VSS, resting in bed with eyes closed and snoring noted. Will continue to monitor closely.

## 2022-04-15 NOTE — Op Note (Signed)
Date of Surgery: 04/15/2022  INDICATIONS: Alan Watkins is a 37 y.o.-year-old male with displaced left tibia fracture.  The risk and benefits of the procedure were discussed in detail and documented in the pre-operative evaluation.   PREOPERATIVE DIAGNOSIS: 1.  Displaced left tibial shaft fracture  POSTOPERATIVE DIAGNOSIS: Same.  PROCEDURE: 1.  Intramedullary nail left tibia  SURGEON: Yevonne Pax MD  ASSISTANT: Raynelle Fanning, ATC  ANESTHESIA:  general  IV FLUIDS AND URINE: See anesthesia record.  ANTIBIOTICS: Ancef  ESTIMATED BLOOD LOSS: 10 mL.  IMPLANTS:  Implant Name Type Inv. Item Serial No. Manufacturer Lot No. LRB No. Used Action  NAIL IM TIB NN 8.3X360 - [id number] Nail NAIL IM TIB NN 8.3X360  [employer](ORTH,TRAU,BIO,SG) [id number] Left 1 Implanted  SCREW CORT IM NAIL 4X32.5 - [id number] Screw SCREW CORT IM NAIL 4X32.5  [employer](ORTH,TRAU,BIO,SG) [id number] Left 1 Implanted  SCREW CORT IM NAIL 4X30 - [id number] Screw SCREW CORT IM NAIL 4X30  [employer](ORTH,TRAU,BIO,SG) [id number] Left 1 Implanted  SCREW CORT IM NAIL 4X37.5 - [id number] Screw SCREW CORT IM NAIL 4X37.5  [employer](ORTH,TRAU,BIO,SG) [id number] Left 1 Implanted  SCREW BONE 5.0X37.5MM CORT Z - [id number] Screw SCREW BONE 5.0X37.5MM CORT Z  [employer](ORTH,TRAU,BIO,SG) [id number] Left 1 Implanted  SCREW CORT FT ST 5X4.7 - [id number] Screw SCREW CORT FT ST 5X4.7  [employer](ORTH,TRAU,BIO,SG) [id number] Left 1 Implanted    DRAINS: None  CULTURES: None  COMPLICATIONS: none  DESCRIPTION OF PROCEDURE:   The patient was identified in the preoperative holding area.  The correct site was noted and timeout held according universal protocol with nursing.  The patient was subsequently taken back to the operating room.  Appropriate antibiotics were given 1 hour prior to skin incision.   The patient was transferred over the operating room table.  Anesthesia was induced.  The leg was prepped and draped in the usual  sterile fashion with a bump under the hip.  All bony prominences were padded.  Final timeout was performed. Following this attention was then turned to the knee. Lateral parapatellar approach was utilized.  A 15 blade was used to incise sharply down through skin and subcutaneous tissue.  Electrocautery was used to achieve hemostasis.  Dissection was continued down through the first layer of the retinaculum.  Metzenbaums were used to open the retinaculum through layer one of the knee.  At this point it was confirmed to be extracapsular but under the patella tendon.[name] was used to localize the starting point.  AP and lateral fluoroscopy confirmed that the pin was at the tip of the tibial eminence as well as the medial aspect of the lateral tibial spine.  The pin was malleted into place.  We subsequently used the opening reamer.  Ball-tipped guidewire.  At this time given that the reduction was being displaced with the reamer, the decision was made to open.  The fracture was provisionally reduced with a pointed reduction clamp.  Likewise an attempt was made at a blocking screw although this also lost the reduction.  The ball-tipped wire was passed to the level of the distal tibia metaphysis centrally.  This was confirmed again on AP and lateral fluoroscopy.  The length of the nail was measured off of this intramedullary wire.  Subsequently reaming was performed until good cortical chatter was achieved.  A size nail was selected to be 1.5 mm smaller than the largest reamer.  At this time 2 screws were placed distally from the medial to lateral fashion.  This was done using perfect circle technique.  15 blade was used to incise through skin medially.  This was done just through skin.  A hemostat was used to spread to bone.  A drill was then used in perfect circle technique bicortically.  This was subsequently measured and screw was placed.  This was performed 1 additional time in the static hole.  Proximally, 2  holes were placed again in static fashion using the extra medullary outrigger guide.  15 blade was used to again incise through just skin.  Hemostat was used to spread down to the level of bone.  The trocars were introduced and subsequent drilling and screw placement was performed.  AP and lateral fluoroscopy was obtained following removal of the extracorporeal jig to confirm nail size, anatomic reduction, screw size.  Final stress examination of the ankle was performed which revealed no syndesmotic widening consistent with a stable ankle fracture.  The wounds were thoroughly irrigated closed in layers of 0 Vicryl 2-0 Vicryl and staples.  Soft dressing was applied using gauze, Xeroform, Webril, Ace wrap.  A cam boot walker was applied.  All counts were correct at the end of the case.  There were no complications.  The patient was taken to the PACU in stable condition.     POSTOPERATIVE PLAN: He will be weightbearing as tolerated on the left leg.  He will begin physical therapy and begin range of motion and weightbearing as tolerated.  He will be placed on anticoagulation per the trauma team although I would recommend either aspirin 325 mg daily or Lovenox.  Yevonne Pax, MD 11:19 AM

## 2022-04-15 NOTE — Transfer of Care (Signed)
Immediate Anesthesia Transfer of Care Note  Patient: Alan Watkins  Procedure(s) Performed: INTRAMEDULLARY (IM) NAIL TIBIAL (Left)  Patient Location: PACU  Anesthesia Type:General  Level of Consciousness: awake, drowsy, and patient cooperative  Airway & Oxygen Therapy: Patient Spontanous Breathing and Patient connected to nasal cannula oxygen  Post-op Assessment: Report given to RN and Post -op Vital signs reviewed and stable  Post vital signs: Reviewed and stable  Last Vitals:  Vitals Value Taken Time  BP 133/67 04/15/22 1123  Temp    Pulse 106 04/15/22 1128  Resp 23 04/15/22 1128  SpO2 95 % 04/15/22 1128  Vitals shown include unvalidated device data.  Last Pain:  Vitals:   04/15/22 0824  TempSrc: Oral  PainSc: 0-No pain      Patients Stated Pain Goal: 3 (67/89/38 1017)  Complications: No notable events documented.

## 2022-04-16 ENCOUNTER — Inpatient Hospital Stay (HOSPITAL_COMMUNITY): Payer: BC Managed Care – PPO

## 2022-04-16 ENCOUNTER — Encounter (HOSPITAL_COMMUNITY): Payer: Self-pay | Admitting: Orthopaedic Surgery

## 2022-04-16 LAB — BASIC METABOLIC PANEL
Anion gap: 9 (ref 5–15)
BUN: 6 mg/dL (ref 6–20)
CO2: 25 mmol/L (ref 22–32)
Calcium: 7.9 mg/dL — ABNORMAL LOW (ref 8.9–10.3)
Chloride: 101 mmol/L (ref 98–111)
Creatinine, Ser: 0.92 mg/dL (ref 0.61–1.24)
GFR, Estimated: >60 mL/min (ref >60)
Glucose, Bld: 100 mg/dL — ABNORMAL HIGH (ref 70–99)
Potassium: 3.9 mmol/L (ref 3.5–5.1)
Sodium: 135 mmol/L (ref 135–145)

## 2022-04-16 LAB — CBC
HCT: 22.9 % — ABNORMAL LOW (ref 39.0–52.0)
Hemoglobin: 8 g/dL — ABNORMAL LOW (ref 13.0–17.0)
MCH: 29.6 pg (ref 26.0–34.0)
MCHC: 34.9 g/dL (ref 30.0–36.0)
MCV: 84.8 fL (ref 80.0–100.0)
Platelets: 147 10*3/uL — ABNORMAL LOW (ref 150–400)
RBC: 2.7 MIL/uL — ABNORMAL LOW (ref 4.22–5.81)
RDW: 13.1 % (ref 11.5–15.5)
WBC: 5.8 10*3/uL (ref 4.0–10.5)
nRBC: 0 % (ref 0.0–0.2)

## 2022-04-16 MED ORDER — VITAMIN D (ERGOCALCIFEROL) 1.25 MG (50000 UNIT) PO CAPS
50000.0000 [IU] | ORAL_CAPSULE | ORAL | Status: DC
  Administered 2022-04-17: 50000 [IU] via ORAL
  Filled 2022-04-16: qty 1

## 2022-04-16 MED ORDER — ADULT MULTIVITAMIN W/MINERALS CH
1.0000 | ORAL_TABLET | Freq: Every day | ORAL | Status: DC
  Administered 2022-04-16 – 2022-04-19 (×4): 1 via ORAL
  Filled 2022-04-16 (×4): qty 1

## 2022-04-16 MED ORDER — METHIMAZOLE 5 MG PO TABS
5.0000 mg | ORAL_TABLET | Freq: Every day | ORAL | Status: DC
  Administered 2022-04-17 – 2022-04-19 (×3): 5 mg via ORAL
  Filled 2022-04-16 (×5): qty 1

## 2022-04-16 MED ORDER — POLYETHYLENE GLYCOL 3350 17 G PO PACK
17.0000 g | PACK | Freq: Every day | ORAL | Status: DC | PRN
  Administered 2022-04-17: 17 g via ORAL
  Filled 2022-04-16: qty 1
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
## 2022-04-16 NOTE — Care Plan (Signed)
Assumed care of patient transferred to room 14.  He was brought on bed, accompanied by RN.  He is A&O, VSS, bilateral INTs noted.  Ace wrapping noted to LLE, toes are warm, patient moves toes on command, CSM intact. Edema of 1+ noted.  Pedal pulse palpated.  NADN.  Sreece, RN
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
## 2022-04-17 LAB — CBC
HCT: 25.2 % — ABNORMAL LOW (ref 39.0–52.0)
Hemoglobin: 8.3 g/dL — ABNORMAL LOW (ref 13.0–17.0)
MCH: 28.7 pg (ref 26.0–34.0)
MCHC: 32.9 g/dL (ref 30.0–36.0)
MCV: 87.2 fL (ref 80.0–100.0)
Platelets: 171 10*3/uL (ref 150–400)
RBC: 2.89 MIL/uL — ABNORMAL LOW (ref 4.22–5.81)
RDW: 13 % (ref 11.5–15.5)
WBC: 5.7 10*3/uL (ref 4.0–10.5)
nRBC: 0 % (ref 0.0–0.2)

## 2022-04-17 LAB — BASIC METABOLIC PANEL
Anion gap: 10 (ref 5–15)
BUN: 8 mg/dL (ref 6–20)
CO2: 27 mmol/L (ref 22–32)
Calcium: 8.3 mg/dL — ABNORMAL LOW (ref 8.9–10.3)
Chloride: 101 mmol/L (ref 98–111)
Creatinine, Ser: 0.98 mg/dL (ref 0.61–1.24)
GFR, Estimated: >60 mL/min (ref >60)
Glucose, Bld: 107 mg/dL — ABNORMAL HIGH (ref 70–99)
Potassium: 3.9 mmol/L (ref 3.5–5.1)
Sodium: 138 mmol/L (ref 135–145)
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
## 2022-04-17 NOTE — Care Plan (Signed)
RN entered room where gf has patient standing on side of bed.  He has voided in bsc.  He is tolerating standing well with most of the weight on the right leg vs. Left.  RN and gf assist patient with sitting and he completes activity well.  Medications given.  He denies further needs at present.  CSM intact to LLE.  NADN. Sreece, RN
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
## 2022-04-17 NOTE — Care Plan (Signed)
RN had patient to remove O2.  He is currenlty on the phone and talking.  RN goes back to room after 5 minutes and checks saturations.  Initial eval O2 RA was 95.  RN continued eval while patient talking on phone for additional 5 minutes.  RA saturations only dropped to 91%.  Patient continued talking.  RN had patient to replace O2 and he continued his conversation.  NADN. Sreece, RN
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
## 2022-04-18 LAB — BASIC METABOLIC PANEL
Anion gap: 9 (ref 5–15)
BUN: 8 mg/dL (ref 6–20)
CO2: 25 mmol/L (ref 22–32)
Calcium: 8.3 mg/dL — ABNORMAL LOW (ref 8.9–10.3)
Chloride: 102 mmol/L (ref 98–111)
Creatinine, Ser: 0.83 mg/dL (ref 0.61–1.24)
GFR, Estimated: >60 mL/min (ref >60)
Glucose, Bld: 104 mg/dL — ABNORMAL HIGH (ref 70–99)
Potassium: 3.8 mmol/L (ref 3.5–5.1)
Sodium: 136 mmol/L (ref 135–145)

## 2022-04-18 LAB — CBC
HCT: 23.6 % — ABNORMAL LOW (ref 39.0–52.0)
Hemoglobin: 7.6 g/dL — ABNORMAL LOW (ref 13.0–17.0)
MCH: 28.4 pg (ref 26.0–34.0)
MCHC: 32.2 g/dL (ref 30.0–36.0)
MCV: 88.1 fL (ref 80.0–100.0)
Platelets: 191 10*3/uL (ref 150–400)
RBC: 2.68 MIL/uL — ABNORMAL LOW (ref 4.22–5.81)
RDW: 13.2 % (ref 11.5–15.5)
WBC: 5.4 10*3/uL (ref 4.0–10.5)
nRBC: 0 % (ref 0.0–0.2)

## 2022-04-18 MED ORDER — POLYETHYLENE GLYCOL 3350 17 G PO PACK: 17.0000 g | PACK | Freq: Two times a day (BID) | ORAL | 0 refills | Status: DC

## 2022-04-18 MED ORDER — ACETAMINOPHEN 325 MG PO TABS: 650.0000 mg | ORAL_TABLET | Freq: Four times a day (QID) | ORAL | Status: DC | PRN

## 2022-04-18 MED ORDER — ASPIRIN 325 MG PO TBEC: 325.0000 mg | DELAYED_RELEASE_TABLET | Freq: Every day | ORAL | 11 refills | Status: DC

## 2022-04-18 MED ORDER — OXYCODONE HCL 10 MG PO TABS: 10.0000 mg | ORAL_TABLET | Freq: Four times a day (QID) | ORAL | 0 refills | Status: AC | PRN

## 2022-04-18 MED ORDER — DOCUSATE SODIUM 100 MG PO CAPS: 100.0000 mg | ORAL_CAPSULE | Freq: Two times a day (BID) | ORAL | 0 refills | Status: DC

## 2022-04-18 MED ORDER — GABAPENTIN 300 MG PO CAPS: 300.0000 mg | ORAL_CAPSULE | Freq: Three times a day (TID) | ORAL | 0 refills | Status: DC

## 2022-04-18 MED ORDER — POLYETHYLENE GLYCOL 3350 17 G PO PACK
17.0000 g | PACK | Freq: Every day | ORAL | Status: DC
  Administered 2022-04-18: 17 g via ORAL
  Filled 2022-04-18 (×2): qty 1

## 2022-04-18 NOTE — Progress Notes (Signed)
Per primary RN Ubaldo Glassing, patient awaiting Physical Therapy before discharge.

## 2022-04-18 NOTE — Progress Notes (Signed)
  Progress Note   Date: 04/17/2022  Patient Name: Alan Watkins        MRN#: [id number]   The diagnosis of respiratory failure   Acute respiratory failure with hypoxia was present and is improving with treatment

## 2022-04-18 NOTE — Progress Notes (Signed)
PT Cancellation Note  Patient Details Name: Alan Watkins MRN: [id number] DOB: 26-Jul-1985   Cancelled Treatment:    Reason Eval/Treat Not Completed: (P) Patient declined, no reason specified. Attempted session with pt at 4:45 PM per his request with the PTA earlier today, but pt just received food and was beginning to eat, wanting to wait to mobilize until after he had eaten. Confirmed with RN that the MD had already decided to keep pt overnight with plans to d/c pt in the morning once DME had been delivered. If time permits, PT may be able to attempt session 1 more time this afternoon, but may have to follow-up in the morning for stair training.     Alan Watkins, PT, DPT Acute Rehabilitation Services  Office: [place] 04/18/2022, 4:51 PM

## 2022-04-18 NOTE — Progress Notes (Signed)
PT Cancellation Note  Patient Details Name: Alan Watkins MRN: [id number] DOB: May 05, 1985   Cancelled Treatment:    Reason Eval/Treat Not Completed: (P) Patient declined, no reason specified (PTA attempted to see pt 3 times (9:48am, 3:15pm, 4:15pm). Pt reports already getting up 2 times and not having any energy due to not eating anything today. Pt says he is waiting for wife to bring food and asks if PTA can come back at 4:45 for stair training. Pt was informed that this is not possible due to scheduling.)   Michelle Nasuti 04/18/2022, 4:44 PM

## 2022-04-18 NOTE — TOC CM/SW Note (Cosign Needed)
Patient is confined to one room and unable to ambulate to the bathroom, therefore needing a commode at the bedside.    Reinaldo Raddle, RN, BSN  Trauma/Neuro ICU Case Manager [phone]

## 2022-04-18 NOTE — Plan of Care (Signed)

## 2022-04-18 NOTE — Progress Notes (Signed)
   Subjective:   Worked with PT to get up multiple times. Overall doing well.   Objective:   VITALS:   Vitals:   04/17/22 0825 04/17/22 1536 04/17/22 2050 04/18/22 0409  BP: (!) 104/52 119/82 130/84 107/65  Pulse: 94 95 82 84  Resp: 16 16 18 16   Temp: 98.5 F (36.9 C) 98.4 F (36.9 C) 98.5 F (36.9 C) 98.5 F (36.9 C)  TempSrc: Oral Oral Oral Oral  SpO2: 99% 100% 95% 100%  Weight:      Height:        Left lower extremity dressing is clean dry intact.  Sensation is intact to light touch in all distributions of the left foot.  Fires EHL as well as tibialis anterior gastrocsoleus.  2+ dorsalis pedis pulse  Lab Results  Component Value Date   WBC 5.4 04/18/2022   HGB 7.6 (L) 04/18/2022   HCT 23.6 (L) 04/18/2022   MCV 88.1 04/18/2022   PLT 191 04/18/2022     Assessment/Plan:  3 Days Post-Op status post left tibial intramedullary nailing, overall doing well  - Expected postop acute blood loss anemia - will monitor for symptoms - Patient to work with PT to optimize mobilization safely - DVT ppx - SCDs, ambulation, Lovenox twice daily - WBAT operative left leg.  He may utilize the right and left arm as tolerated as well actively without restriction - Pain control - multimodal pain management, ATC acetaminophen in conjunction with as needed narcotic (oxycodone), although this should be minimized with other modalities    Kimyah Frein 04/18/2022, 6:49 AM
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
## 2022-04-19 ENCOUNTER — Telehealth: Payer: Self-pay | Admitting: Orthopaedic Surgery

## 2022-04-19 NOTE — Telephone Encounter (Signed)
Patient was discharged from Hospital today and would like to know when to come in for follow up also none of his Rxs were at the pharmacy please advise
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
## 2022-04-20 NOTE — Telephone Encounter (Signed)
I called. He has his medications. He needs postop appointment which I cannot provide. Looks like op note says he has staples. I recommended calling on Monday to get appointment for 10-14 days out from surgery with Dr Sammuel Hines.  He is feeling well with no major complaints

## 2022-04-22 NOTE — Telephone Encounter (Signed)
Pt scheduled 2.16

## 2022-04-26 ENCOUNTER — Ambulatory Visit (INDEPENDENT_AMBULATORY_CARE_PROVIDER_SITE_OTHER): Payer: BC Managed Care – PPO | Admitting: Orthopaedic Surgery

## 2022-04-26 ENCOUNTER — Ambulatory Visit (INDEPENDENT_AMBULATORY_CARE_PROVIDER_SITE_OTHER): Payer: BC Managed Care – PPO

## 2022-04-26 DIAGNOSIS — S82252A Displaced comminuted fracture of shaft of left tibia, initial encounter for closed fracture: Secondary | ICD-10-CM

## 2022-04-26 DIAGNOSIS — S82832A Other fracture of upper and lower end of left fibula, initial encounter for closed fracture: Secondary | ICD-10-CM | POA: Diagnosis not present

## 2022-04-26 NOTE — Progress Notes (Signed)
Post Operative Evaluation    Procedure/Date of Surgery: Left tibial nail, bilateral scapular fractures  Interval History:   Presents today for follow-up of his left tibia fracture with intramedullary nailing.  He is doing very well.  He is walking with a walker.  Overall the right shoulder he has been able to reestablish normal range of motion of the left shoulder is significantly painful.  He has very limited range of motion about the left shoulder at today's visit.   PMH/PSH/Family History/Social History/Meds/Allergies:    Past Medical History:  Diagnosis Date   Hypothyroidism    Past Surgical History:  Procedure Laterality Date   TIBIA IM NAIL INSERTION Left 04/15/2022   Procedure: INTRAMEDULLARY (IM) NAIL TIBIAL;  Surgeon: Vanetta Mulders, MD;  Location: [facility];  Service: Orthopedics;  Laterality: Left;   VASECTOMY  2019   Social History   Socioeconomic History   Marital status: Married    Spouse name: Not on file   Number of children: 2   Years of education: Not on file   Highest education level: Not on file  Occupational History   Not on file  Tobacco Use   Smoking status: Never   Smokeless tobacco: Never  Vaping Use   Vaping Use: Never used  Substance and Sexual Activity   Alcohol use: Yes    Alcohol/week: 2.0 - 4.0 standard drinks of alcohol    Types: 2 - 4 Shots of liquor per week    Comment: Occasional   Drug use: Yes    Types: Marijuana   Sexual activity: Yes    Birth control/protection: Surgical    Comment: vasectomy  Other Topics Concern   Not on file  Social History Narrative   Door assembly   Social Determinants of Health   Financial Resource Strain: Not on file  Food Insecurity: No Food Insecurity (04/16/2022)   Hunger Vital Sign    Worried About Running Out of Food in the Last Year: Never true    Ran Out of Food in the Last Year: Never true  Transportation Needs: No Transportation Needs (04/16/2022)   PRAPARE -  Hydrologist (Medical): No    Lack of Transportation (Non-Medical): No  Physical Activity: Not on file  Stress: Not on file  Social Connections: Not on file   Family History  Problem Relation Age of Onset   Hypertension Mother    Diabetes Mother    Alcohol abuse Maternal Grandmother    Hypertension Maternal Grandmother    Diabetes Maternal Grandmother    No Known Allergies Current Outpatient Medications  Medication Sig Dispense Refill   acetaminophen (TYLENOL) 325 MG tablet Take 2 tablets (650 mg total) by mouth every 6 (six) hours as needed.     aspirin EC 325 MG tablet Take 1 tablet (325 mg total) by mouth daily. 30 tablet 11   docusate sodium (COLACE) 100 MG capsule Take 1 capsule (100 mg total) by mouth 2 (two) times daily. 10 capsule 0   ergocalciferol (VITAMIN D2) 1.25 MG (50000 UT) capsule Take 1 capsule (50,000 Units total) by mouth once a week. (Patient taking differently: Take 50,000 Units by mouth once a week. Wednesday) 12 capsule 2   gabapentin (NEURONTIN) 300 MG capsule Take 1 capsule (300 mg total) by mouth 3 (three) times  daily. 90 capsule 0   methimazole (TAPAZOLE) 5 MG tablet Take 1 tablet (5 mg total) by mouth daily. 90 tablet 1   Multiple Vitamin (MULTIVITAMIN) tablet Take 1 tablet by mouth daily.     polyethylene glycol (MIRALAX / GLYCOLAX) 17 g packet Take 17 g by mouth 2 (two) times daily. 14 each 0   No current facility-administered medications for this visit.   No results found.  Review of Systems:   A ROS was performed including pertinent positives and negatives as documented in the HPI.   Musculoskeletal Exam:    There were no vitals taken for this visit.  Active range of motion about the left shoulder is approximately 20 degrees forward elevation with neutral external rotation.  Unable to go behind the back.  Passively is able to elevate to 120 degrees.  Left lower extremity knee range of motion is from 0 to 90 degrees.   Incisions are well-appearing without erythema or drainage.  Staples removed today.  Sensation is intact in all distributions fires EHL tibialis anterior as well as 2+ dorsalis pedis pulse  Imaging:    2 views left tib-fib: Status post intramedullary nailing without evidence of complication  I personally reviewed and interpreted the radiographs.   Assessment:   2 weeks status post intramedullary nailing of the left tibia without complication.  I have described that his limited range of motion I do believe is likely related to a fracture of the base of the supraspinatus fossa.  I did discuss that we will plan to get him going with physical therapy for the left leg as well as the left shoulder.  If he is not able to progress his active range of motion of the left shoulder, we will ultimately need to obtain an MRI of the left shoulder.  Do believe he would be a good candidate for aquatic therapy starting in 2 weeks when his wounds are completely healed.  Plan :    -Return to clinic 4 weeks      I personally saw and evaluated the patient, and participated in the management and treatment plan.  Vanetta Mulders, MD Attending Physician, Orthopedic Surgery  This document was dictated using Dragon voice recognition software. A reasonable attempt at proof reading has been made to minimize errors.
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
## 2022-04-29 ENCOUNTER — Other Ambulatory Visit: Payer: Self-pay

## 2022-04-29 ENCOUNTER — Ambulatory Visit (HOSPITAL_BASED_OUTPATIENT_CLINIC_OR_DEPARTMENT_OTHER): Payer: BC Managed Care – PPO | Attending: Orthopaedic Surgery | Admitting: Physical Therapy

## 2022-04-29 DIAGNOSIS — M25512 Pain in left shoulder: Secondary | ICD-10-CM | POA: Diagnosis not present

## 2022-04-29 DIAGNOSIS — M25612 Stiffness of left shoulder, not elsewhere classified: Secondary | ICD-10-CM | POA: Insufficient documentation

## 2022-04-29 DIAGNOSIS — M25662 Stiffness of left knee, not elsewhere classified: Secondary | ICD-10-CM | POA: Insufficient documentation

## 2022-04-29 DIAGNOSIS — S82252A Displaced comminuted fracture of shaft of left tibia, initial encounter for closed fracture: Secondary | ICD-10-CM | POA: Insufficient documentation

## 2022-04-29 DIAGNOSIS — M6281 Muscle weakness (generalized): Secondary | ICD-10-CM | POA: Insufficient documentation

## 2022-04-29 DIAGNOSIS — R262 Difficulty in walking, not elsewhere classified: Secondary | ICD-10-CM | POA: Diagnosis not present

## 2022-04-29 DIAGNOSIS — M25562 Pain in left knee: Secondary | ICD-10-CM | POA: Insufficient documentation

## 2022-04-30 ENCOUNTER — Encounter (HOSPITAL_BASED_OUTPATIENT_CLINIC_OR_DEPARTMENT_OTHER): Payer: Self-pay | Admitting: Physical Therapy

## 2022-05-10 ENCOUNTER — Encounter (HOSPITAL_BASED_OUTPATIENT_CLINIC_OR_DEPARTMENT_OTHER): Payer: Self-pay | Admitting: Physical Therapy

## 2022-05-10 ENCOUNTER — Ambulatory Visit (HOSPITAL_BASED_OUTPATIENT_CLINIC_OR_DEPARTMENT_OTHER): Payer: BC Managed Care – PPO | Attending: Orthopaedic Surgery | Admitting: Physical Therapy

## 2022-05-10 DIAGNOSIS — M6281 Muscle weakness (generalized): Secondary | ICD-10-CM | POA: Diagnosis not present

## 2022-05-10 DIAGNOSIS — M25562 Pain in left knee: Secondary | ICD-10-CM | POA: Diagnosis not present

## 2022-05-10 DIAGNOSIS — M25512 Pain in left shoulder: Secondary | ICD-10-CM | POA: Diagnosis not present

## 2022-05-10 DIAGNOSIS — M25662 Stiffness of left knee, not elsewhere classified: Secondary | ICD-10-CM | POA: Insufficient documentation

## 2022-05-10 DIAGNOSIS — R262 Difficulty in walking, not elsewhere classified: Secondary | ICD-10-CM | POA: Insufficient documentation

## 2022-05-10 DIAGNOSIS — M25612 Stiffness of left shoulder, not elsewhere classified: Secondary | ICD-10-CM | POA: Diagnosis not present
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
## 2022-05-15 ENCOUNTER — Encounter (HOSPITAL_BASED_OUTPATIENT_CLINIC_OR_DEPARTMENT_OTHER): Payer: BC Managed Care – PPO | Admitting: Physical Therapy

## 2022-05-16 ENCOUNTER — Ambulatory Visit (HOSPITAL_BASED_OUTPATIENT_CLINIC_OR_DEPARTMENT_OTHER): Payer: BC Managed Care – PPO | Admitting: Physical Therapy

## 2022-05-16 DIAGNOSIS — R262 Difficulty in walking, not elsewhere classified: Secondary | ICD-10-CM

## 2022-05-16 DIAGNOSIS — M25662 Stiffness of left knee, not elsewhere classified: Secondary | ICD-10-CM | POA: Diagnosis not present

## 2022-05-16 DIAGNOSIS — M25562 Pain in left knee: Secondary | ICD-10-CM

## 2022-05-16 DIAGNOSIS — M6281 Muscle weakness (generalized): Secondary | ICD-10-CM

## 2022-05-16 DIAGNOSIS — M25612 Stiffness of left shoulder, not elsewhere classified: Secondary | ICD-10-CM | POA: Diagnosis not present

## 2022-05-16 DIAGNOSIS — M25512 Pain in left shoulder: Secondary | ICD-10-CM | POA: Diagnosis not present
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
## 2022-05-17 ENCOUNTER — Encounter (HOSPITAL_BASED_OUTPATIENT_CLINIC_OR_DEPARTMENT_OTHER): Payer: Self-pay | Admitting: Physical Therapy

## 2022-05-17 ENCOUNTER — Encounter (HOSPITAL_BASED_OUTPATIENT_CLINIC_OR_DEPARTMENT_OTHER): Payer: BC Managed Care – PPO | Admitting: Physical Therapy

## 2022-05-18 DIAGNOSIS — J942 Hemothorax: Secondary | ICD-10-CM | POA: Diagnosis not present

## 2022-05-18 DIAGNOSIS — S82252A Displaced comminuted fracture of shaft of left tibia, initial encounter for closed fracture: Secondary | ICD-10-CM | POA: Diagnosis not present

## 2022-05-24 ENCOUNTER — Ambulatory Visit (INDEPENDENT_AMBULATORY_CARE_PROVIDER_SITE_OTHER): Payer: BC Managed Care – PPO | Admitting: Orthopaedic Surgery

## 2022-05-24 ENCOUNTER — Ambulatory Visit (INDEPENDENT_AMBULATORY_CARE_PROVIDER_SITE_OTHER): Payer: BC Managed Care – PPO

## 2022-05-24 ENCOUNTER — Ambulatory Visit (HOSPITAL_BASED_OUTPATIENT_CLINIC_OR_DEPARTMENT_OTHER): Payer: BC Managed Care – PPO | Admitting: Physical Therapy

## 2022-05-24 DIAGNOSIS — R262 Difficulty in walking, not elsewhere classified: Secondary | ICD-10-CM

## 2022-05-24 DIAGNOSIS — M6281 Muscle weakness (generalized): Secondary | ICD-10-CM

## 2022-05-24 DIAGNOSIS — M25662 Stiffness of left knee, not elsewhere classified: Secondary | ICD-10-CM

## 2022-05-24 DIAGNOSIS — S82252A Displaced comminuted fracture of shaft of left tibia, initial encounter for closed fracture: Secondary | ICD-10-CM

## 2022-05-24 DIAGNOSIS — M25512 Pain in left shoulder: Secondary | ICD-10-CM | POA: Diagnosis not present

## 2022-05-24 DIAGNOSIS — M25612 Stiffness of left shoulder, not elsewhere classified: Secondary | ICD-10-CM | POA: Diagnosis not present

## 2022-05-24 DIAGNOSIS — M25562 Pain in left knee: Secondary | ICD-10-CM

## 2022-05-24 DIAGNOSIS — S82192A Other fracture of upper end of left tibia, initial encounter for closed fracture: Secondary | ICD-10-CM | POA: Diagnosis not present

## 2022-05-24 NOTE — Progress Notes (Signed)
Post Operative Evaluation    Procedure/Date of Surgery: Left tibial nail, bilateral scapular fractures  Interval History:   05/24/2022: Presents today for follow-up of the above procedure.  His left shoulder is feeling dramatically better.  He is now able to forward elevate overhead equal to the contralateral side  Presents today for follow-up of his left tibia fracture with intramedullary nailing.  He is doing very well.  He is walking with a walker.  Overall the right shoulder he has been able to reestablish normal range of motion of the left shoulder is significantly painful.  He has very limited range of motion about the left shoulder at today's visit.   PMH/PSH/Family History/Social History/Meds/Allergies:    Past Medical History:  Diagnosis Date   Hypothyroidism    Past Surgical History:  Procedure Laterality Date   TIBIA IM NAIL INSERTION Left 04/15/2022   Procedure: INTRAMEDULLARY (IM) NAIL TIBIAL;  Surgeon: Vanetta Mulders, MD;  Location: [place];  Service: Orthopedics;  Laterality: Left;   VASECTOMY  2019   Social History   Socioeconomic History   Marital status: Married    Spouse name: Not on file   Number of children: 2   Years of education: Not on file   Highest education level: Not on file  Occupational History   Not on file  Tobacco Use   Smoking status: Never   Smokeless tobacco: Never  Vaping Use   Vaping Use: Never used  Substance and Sexual Activity   Alcohol use: Yes    Alcohol/week: 2.0 - 4.0 standard drinks of alcohol    Types: 2 - 4 Shots of liquor per week    Comment: Occasional   Drug use: Yes    Types: Marijuana   Sexual activity: Yes    Birth control/protection: Surgical    Comment: vasectomy  Other Topics Concern   Not on file  Social History Narrative   Door assembly   Social Determinants of Health   Financial Resource Strain: Not on file  Food Insecurity: No Food Insecurity (04/16/2022)   Hunger  Vital Sign    Worried About Running Out of Food in the Last Year: Never true    Ran Out of Food in the Last Year: Never true  Transportation Needs: No Transportation Needs (04/16/2022)   PRAPARE - Hydrologist (Medical): No    Lack of Transportation (Non-Medical): No  Physical Activity: Not on file  Stress: Not on file  Social Connections: Not on file   Family History  Problem Relation Age of Onset   Hypertension Mother    Diabetes Mother    Alcohol abuse Maternal Grandmother    Hypertension Maternal Grandmother    Diabetes Maternal Grandmother    No Known Allergies Current Outpatient Medications  Medication Sig Dispense Refill   acetaminophen (TYLENOL) 325 MG tablet Take 2 tablets (650 mg total) by mouth every 6 (six) hours as needed.     aspirin EC 325 MG tablet Take 1 tablet (325 mg total) by mouth daily. 30 tablet 11   docusate sodium (COLACE) 100 MG capsule Take 1 capsule (100 mg total) by mouth 2 (two) times daily. (Patient not taking: Reported on 04/29/2022) 10 capsule 0   ergocalciferol (VITAMIN D2) 1.25 MG (50000 UT) capsule Take 1 capsule (50,000 Units total)  by mouth once a week. (Patient taking differently: Take 50,000 Units by mouth once a week. Wednesday) 12 capsule 2   gabapentin (NEURONTIN) 300 MG capsule Take 1 capsule (300 mg total) by mouth 3 (three) times daily. 90 capsule 0   methimazole (TAPAZOLE) 5 MG tablet Take 1 tablet (5 mg total) by mouth daily. 90 tablet 1   Multiple Vitamin (MULTIVITAMIN) tablet Take 1 tablet by mouth daily.     polyethylene glycol (MIRALAX / GLYCOLAX) 17 g packet Take 17 g by mouth 2 (two) times daily. 14 each 0   No current facility-administered medications for this visit.   No results found.  Review of Systems:   A ROS was performed including pertinent positives and negatives as documented in the HPI.   Musculoskeletal Exam:    There were no vitals taken for this visit.  Active range of motion about  the left shoulder is approximately 20 degrees forward elevation with neutral external rotation.  Unable to go behind the back.  Passively is able to elevate to 120 degrees.  Left lower extremity knee range of motion is from 0 to 90 degrees.  Incisions are well-appearing without erythema or drainage.  Staples removed today.  Sensation is intact in all distributions fires EHL tibialis anterior as well as 2+ dorsalis pedis pulse  Imaging:    2 views left tib-fib: Status post intramedullary nailing without evidence of complication is some evidence of early callus formation  I personally reviewed and interpreted the radiographs.   Assessment:   6 weeks status post intramedullary nailing of the left tibia without complication.  Overall his range of motion as well as weightbearing status is improving.  His left shoulder function has also improved dramatically.  At today's visit I would like him to continue to weight-bear as tolerated and progress with weightbearing and range of motion of the knee and ankle.  I will plan to see him back in 6 weeks for reassessment  Plan :    -Return to clinic 6 weeks      I personally saw and evaluated the patient, and participated in the management and treatment plan.  Vanetta Mulders, MD Attending Physician, Orthopedic Surgery  This document was dictated using Dragon voice recognition software. A reasonable attempt at proof reading has been made to minimize errors.
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
## 2022-05-25 ENCOUNTER — Encounter (HOSPITAL_BASED_OUTPATIENT_CLINIC_OR_DEPARTMENT_OTHER): Payer: Self-pay | Admitting: Physical Therapy

## 2022-05-28 ENCOUNTER — Encounter (HOSPITAL_BASED_OUTPATIENT_CLINIC_OR_DEPARTMENT_OTHER): Payer: Self-pay | Admitting: Physical Therapy

## 2022-05-28 ENCOUNTER — Ambulatory Visit (HOSPITAL_BASED_OUTPATIENT_CLINIC_OR_DEPARTMENT_OTHER): Payer: BC Managed Care – PPO | Admitting: Physical Therapy

## 2022-05-28 DIAGNOSIS — M6281 Muscle weakness (generalized): Secondary | ICD-10-CM

## 2022-05-28 DIAGNOSIS — M25612 Stiffness of left shoulder, not elsewhere classified: Secondary | ICD-10-CM | POA: Diagnosis not present

## 2022-05-28 DIAGNOSIS — M25562 Pain in left knee: Secondary | ICD-10-CM | POA: Diagnosis not present

## 2022-05-28 DIAGNOSIS — M25512 Pain in left shoulder: Secondary | ICD-10-CM | POA: Diagnosis not present

## 2022-05-28 DIAGNOSIS — M25662 Stiffness of left knee, not elsewhere classified: Secondary | ICD-10-CM

## 2022-05-28 DIAGNOSIS — R262 Difficulty in walking, not elsewhere classified: Secondary | ICD-10-CM
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
## 2022-05-30 ENCOUNTER — Encounter (HOSPITAL_BASED_OUTPATIENT_CLINIC_OR_DEPARTMENT_OTHER): Payer: Self-pay | Admitting: Orthopaedic Surgery

## 2022-05-31 ENCOUNTER — Encounter (HOSPITAL_BASED_OUTPATIENT_CLINIC_OR_DEPARTMENT_OTHER): Payer: Self-pay | Admitting: Physical Therapy

## 2022-05-31 ENCOUNTER — Ambulatory Visit (HOSPITAL_BASED_OUTPATIENT_CLINIC_OR_DEPARTMENT_OTHER): Payer: BC Managed Care – PPO | Admitting: Physical Therapy

## 2022-05-31 DIAGNOSIS — M25612 Stiffness of left shoulder, not elsewhere classified: Secondary | ICD-10-CM | POA: Diagnosis not present

## 2022-05-31 DIAGNOSIS — M25662 Stiffness of left knee, not elsewhere classified: Secondary | ICD-10-CM

## 2022-05-31 DIAGNOSIS — M25562 Pain in left knee: Secondary | ICD-10-CM | POA: Diagnosis not present

## 2022-05-31 DIAGNOSIS — M6281 Muscle weakness (generalized): Secondary | ICD-10-CM | POA: Diagnosis not present

## 2022-05-31 DIAGNOSIS — R262 Difficulty in walking, not elsewhere classified: Secondary | ICD-10-CM

## 2022-05-31 DIAGNOSIS — M25512 Pain in left shoulder: Secondary | ICD-10-CM | POA: Diagnosis not present
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
## 2022-06-04 ENCOUNTER — Ambulatory Visit (HOSPITAL_BASED_OUTPATIENT_CLINIC_OR_DEPARTMENT_OTHER): Payer: BC Managed Care – PPO | Admitting: Physical Therapy

## 2022-06-04 ENCOUNTER — Encounter (HOSPITAL_BASED_OUTPATIENT_CLINIC_OR_DEPARTMENT_OTHER): Payer: Self-pay | Admitting: Physical Therapy

## 2022-06-04 DIAGNOSIS — M25512 Pain in left shoulder: Secondary | ICD-10-CM | POA: Diagnosis not present

## 2022-06-04 DIAGNOSIS — M6281 Muscle weakness (generalized): Secondary | ICD-10-CM | POA: Diagnosis not present

## 2022-06-04 DIAGNOSIS — R262 Difficulty in walking, not elsewhere classified: Secondary | ICD-10-CM | POA: Diagnosis not present

## 2022-06-04 DIAGNOSIS — M25662 Stiffness of left knee, not elsewhere classified: Secondary | ICD-10-CM | POA: Diagnosis not present

## 2022-06-04 DIAGNOSIS — M25612 Stiffness of left shoulder, not elsewhere classified: Secondary | ICD-10-CM | POA: Diagnosis not present

## 2022-06-04 DIAGNOSIS — M25562 Pain in left knee: Secondary | ICD-10-CM | POA: Diagnosis not present
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
## 2022-06-06 ENCOUNTER — Ambulatory Visit (HOSPITAL_BASED_OUTPATIENT_CLINIC_OR_DEPARTMENT_OTHER): Payer: BC Managed Care – PPO | Admitting: Physical Therapy

## 2022-06-06 ENCOUNTER — Encounter (HOSPITAL_BASED_OUTPATIENT_CLINIC_OR_DEPARTMENT_OTHER): Payer: Self-pay | Admitting: Physical Therapy

## 2022-06-06 DIAGNOSIS — R262 Difficulty in walking, not elsewhere classified: Secondary | ICD-10-CM

## 2022-06-06 DIAGNOSIS — M25562 Pain in left knee: Secondary | ICD-10-CM | POA: Diagnosis not present

## 2022-06-06 DIAGNOSIS — M25612 Stiffness of left shoulder, not elsewhere classified: Secondary | ICD-10-CM | POA: Diagnosis not present

## 2022-06-06 DIAGNOSIS — M25662 Stiffness of left knee, not elsewhere classified: Secondary | ICD-10-CM | POA: Diagnosis not present

## 2022-06-06 DIAGNOSIS — M6281 Muscle weakness (generalized): Secondary | ICD-10-CM

## 2022-06-06 DIAGNOSIS — M25512 Pain in left shoulder: Secondary | ICD-10-CM | POA: Diagnosis not present
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
## 2022-06-11 ENCOUNTER — Ambulatory Visit (HOSPITAL_BASED_OUTPATIENT_CLINIC_OR_DEPARTMENT_OTHER): Payer: BC Managed Care – PPO | Attending: Orthopaedic Surgery | Admitting: Physical Therapy

## 2022-06-11 ENCOUNTER — Encounter (HOSPITAL_BASED_OUTPATIENT_CLINIC_OR_DEPARTMENT_OTHER): Payer: Self-pay | Admitting: Physical Therapy

## 2022-06-11 DIAGNOSIS — M6281 Muscle weakness (generalized): Secondary | ICD-10-CM | POA: Diagnosis not present

## 2022-06-11 DIAGNOSIS — M25562 Pain in left knee: Secondary | ICD-10-CM

## 2022-06-11 DIAGNOSIS — M25662 Stiffness of left knee, not elsewhere classified: Secondary | ICD-10-CM

## 2022-06-11 DIAGNOSIS — M25612 Stiffness of left shoulder, not elsewhere classified: Secondary | ICD-10-CM | POA: Insufficient documentation

## 2022-06-11 DIAGNOSIS — M25512 Pain in left shoulder: Secondary | ICD-10-CM | POA: Diagnosis not present

## 2022-06-11 DIAGNOSIS — R262 Difficulty in walking, not elsewhere classified: Secondary | ICD-10-CM
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
## 2022-06-14 ENCOUNTER — Ambulatory Visit (HOSPITAL_BASED_OUTPATIENT_CLINIC_OR_DEPARTMENT_OTHER): Payer: BC Managed Care – PPO

## 2022-06-14 ENCOUNTER — Encounter (HOSPITAL_BASED_OUTPATIENT_CLINIC_OR_DEPARTMENT_OTHER): Payer: Self-pay

## 2022-06-14 DIAGNOSIS — R262 Difficulty in walking, not elsewhere classified: Secondary | ICD-10-CM | POA: Diagnosis not present

## 2022-06-14 DIAGNOSIS — M25662 Stiffness of left knee, not elsewhere classified: Secondary | ICD-10-CM | POA: Diagnosis not present

## 2022-06-14 DIAGNOSIS — M25612 Stiffness of left shoulder, not elsewhere classified: Secondary | ICD-10-CM | POA: Diagnosis not present

## 2022-06-14 DIAGNOSIS — M6281 Muscle weakness (generalized): Secondary | ICD-10-CM | POA: Diagnosis not present

## 2022-06-14 DIAGNOSIS — M25562 Pain in left knee: Secondary | ICD-10-CM

## 2022-06-14 DIAGNOSIS — M25512 Pain in left shoulder: Secondary | ICD-10-CM
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
## 2022-06-18 ENCOUNTER — Encounter (HOSPITAL_BASED_OUTPATIENT_CLINIC_OR_DEPARTMENT_OTHER): Payer: Self-pay

## 2022-06-18 ENCOUNTER — Ambulatory Visit (HOSPITAL_BASED_OUTPATIENT_CLINIC_OR_DEPARTMENT_OTHER): Payer: BC Managed Care – PPO

## 2022-06-18 DIAGNOSIS — R262 Difficulty in walking, not elsewhere classified: Secondary | ICD-10-CM | POA: Diagnosis not present

## 2022-06-18 DIAGNOSIS — M25512 Pain in left shoulder: Secondary | ICD-10-CM

## 2022-06-18 DIAGNOSIS — M25612 Stiffness of left shoulder, not elsewhere classified: Secondary | ICD-10-CM | POA: Diagnosis not present

## 2022-06-18 DIAGNOSIS — M6281 Muscle weakness (generalized): Secondary | ICD-10-CM | POA: Diagnosis not present

## 2022-06-18 DIAGNOSIS — M25662 Stiffness of left knee, not elsewhere classified: Secondary | ICD-10-CM

## 2022-06-18 DIAGNOSIS — M25562 Pain in left knee: Secondary | ICD-10-CM
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
## 2022-06-21 ENCOUNTER — Encounter (HOSPITAL_BASED_OUTPATIENT_CLINIC_OR_DEPARTMENT_OTHER): Payer: Self-pay

## 2022-06-21 ENCOUNTER — Ambulatory Visit (HOSPITAL_BASED_OUTPATIENT_CLINIC_OR_DEPARTMENT_OTHER): Payer: BC Managed Care – PPO

## 2022-06-21 DIAGNOSIS — M25612 Stiffness of left shoulder, not elsewhere classified: Secondary | ICD-10-CM | POA: Diagnosis not present

## 2022-06-21 DIAGNOSIS — M6281 Muscle weakness (generalized): Secondary | ICD-10-CM

## 2022-06-21 DIAGNOSIS — M25562 Pain in left knee: Secondary | ICD-10-CM

## 2022-06-21 DIAGNOSIS — M25662 Stiffness of left knee, not elsewhere classified: Secondary | ICD-10-CM

## 2022-06-21 DIAGNOSIS — M25512 Pain in left shoulder: Secondary | ICD-10-CM

## 2022-06-21 DIAGNOSIS — R262 Difficulty in walking, not elsewhere classified: Secondary | ICD-10-CM | POA: Diagnosis not present
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
## 2022-06-25 ENCOUNTER — Encounter (HOSPITAL_BASED_OUTPATIENT_CLINIC_OR_DEPARTMENT_OTHER): Payer: Self-pay | Admitting: Physical Therapy

## 2022-06-25 ENCOUNTER — Ambulatory Visit (HOSPITAL_BASED_OUTPATIENT_CLINIC_OR_DEPARTMENT_OTHER): Payer: BC Managed Care – PPO | Admitting: Physical Therapy

## 2022-06-25 DIAGNOSIS — M25562 Pain in left knee: Secondary | ICD-10-CM | POA: Diagnosis not present

## 2022-06-25 DIAGNOSIS — R262 Difficulty in walking, not elsewhere classified: Secondary | ICD-10-CM | POA: Diagnosis not present

## 2022-06-25 DIAGNOSIS — M25662 Stiffness of left knee, not elsewhere classified: Secondary | ICD-10-CM | POA: Diagnosis not present

## 2022-06-25 DIAGNOSIS — M6281 Muscle weakness (generalized): Secondary | ICD-10-CM

## 2022-06-25 DIAGNOSIS — M25612 Stiffness of left shoulder, not elsewhere classified: Secondary | ICD-10-CM | POA: Diagnosis not present

## 2022-06-25 DIAGNOSIS — M25512 Pain in left shoulder: Secondary | ICD-10-CM | POA: Diagnosis not present
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
## 2022-06-28 ENCOUNTER — Ambulatory Visit (HOSPITAL_BASED_OUTPATIENT_CLINIC_OR_DEPARTMENT_OTHER): Payer: BC Managed Care – PPO

## 2022-06-28 ENCOUNTER — Encounter (HOSPITAL_BASED_OUTPATIENT_CLINIC_OR_DEPARTMENT_OTHER): Payer: Self-pay

## 2022-06-28 ENCOUNTER — Ambulatory Visit (INDEPENDENT_AMBULATORY_CARE_PROVIDER_SITE_OTHER): Payer: BC Managed Care – PPO | Admitting: Orthopaedic Surgery

## 2022-06-28 ENCOUNTER — Ambulatory Visit (INDEPENDENT_AMBULATORY_CARE_PROVIDER_SITE_OTHER): Payer: BC Managed Care – PPO

## 2022-06-28 DIAGNOSIS — M25512 Pain in left shoulder: Secondary | ICD-10-CM | POA: Diagnosis not present

## 2022-06-28 DIAGNOSIS — M25612 Stiffness of left shoulder, not elsewhere classified: Secondary | ICD-10-CM | POA: Diagnosis not present

## 2022-06-28 DIAGNOSIS — M6281 Muscle weakness (generalized): Secondary | ICD-10-CM

## 2022-06-28 DIAGNOSIS — M25662 Stiffness of left knee, not elsewhere classified: Secondary | ICD-10-CM | POA: Diagnosis not present

## 2022-06-28 DIAGNOSIS — S82252A Displaced comminuted fracture of shaft of left tibia, initial encounter for closed fracture: Secondary | ICD-10-CM

## 2022-06-28 DIAGNOSIS — R262 Difficulty in walking, not elsewhere classified: Secondary | ICD-10-CM

## 2022-06-28 DIAGNOSIS — M25562 Pain in left knee: Secondary | ICD-10-CM

## 2022-06-28 DIAGNOSIS — S82832D Other fracture of upper and lower end of left fibula, subsequent encounter for closed fracture with routine healing: Secondary | ICD-10-CM | POA: Diagnosis not present

## 2022-06-28 NOTE — Progress Notes (Signed)
Post Operative Evaluation    Procedure/Date of Surgery: Left tibial nail, bilateral scapular fractures  Interval History:   05/24/2022: Presents today for follow-up of his left intramedullary nail.  He is now walking with just a cane.  He is continuing to improve.  He does still have some pain around the fracture with ambulation.  Overall he is continuing to improve and physical therapy is going well.  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  Presents today for follow-up of his left tibia fracture with intramedullary nailing.  He is doing very well.  He is walking with a walker.  Overall the right shoulder he has been able to reestablish normal range of motion of the left shoulder is significantly painful.  He has very limited range of motion about the left shoulder at today's visit.   PMH/PSH/Family History/Social History/Meds/Allergies:    Past Medical History:  Diagnosis Date   Hypothyroidism    Past Surgical History:  Procedure Laterality Date   TIBIA IM NAIL INSERTION Left 04/15/2022   Procedure: INTRAMEDULLARY (IM) NAIL TIBIAL;  Surgeon: Huel Cote, MD;  Location: [facility] OR;  Service: Orthopedics;  Laterality: Left;   VASECTOMY  2019   Social History   Socioeconomic History   Marital status: Married    Spouse name: Not on file   Number of children: 2   Years of education: Not on file   Highest education level: Not on file  Occupational History   Not on file  Tobacco Use   Smoking status: Never   Smokeless tobacco: Never  Vaping Use   Vaping Use: Never used  Substance and Sexual Activity   Alcohol use: Yes    Alcohol/week: 2.0 - 4.0 standard drinks of alcohol    Types: 2 - 4 Shots of liquor per week    Comment: Occasional   Drug use: Yes    Types: Marijuana   Sexual activity: Yes    Birth control/protection: Surgical    Comment: vasectomy  Other Topics Concern   Not on file   Social History Narrative   Door assembly   Social Determinants of Health   Financial Resource Strain: Not on file  Food Insecurity: No Food Insecurity (04/16/2022)   Hunger Vital Sign    Worried About Running Out of Food in the Last Year: Never true    Ran Out of Food in the Last Year: Never true  Transportation Needs: No Transportation Needs (04/16/2022)   PRAPARE - Administrator, Civil Service (Medical): No    Lack of Transportation (Non-Medical): No  Physical Activity: Not on file  Stress: Not on file  Social Connections: Not on file   Family History  Problem Relation Age of Onset   Hypertension Mother    Diabetes Mother    Alcohol abuse Maternal Grandmother    Hypertension Maternal Grandmother    Diabetes Maternal Grandmother    No Known Allergies Current Outpatient Medications  Medication Sig Dispense Refill   acetaminophen (TYLENOL) 325 MG tablet Take 2 tablets (650 mg total) by mouth every 6 (six) hours as needed.     aspirin EC 325 MG tablet Take 1 tablet (325 mg total) by mouth daily. 30 tablet 11   docusate sodium (COLACE) 100 MG capsule Take  1 capsule (100 mg total) by mouth 2 (two) times daily. (Patient not taking: Reported on 04/29/2022) 10 capsule 0   ergocalciferol (VITAMIN D2) 1.25 MG (50000 UT) capsule Take 1 capsule (50,000 Units total) by mouth once a week. (Patient taking differently: Take 50,000 Units by mouth once a week. Wednesday) 12 capsule 2   gabapentin (NEURONTIN) 300 MG capsule Take 1 capsule (300 mg total) by mouth 3 (three) times daily. 90 capsule 0   methimazole (TAPAZOLE) 5 MG tablet Take 1 tablet (5 mg total) by mouth daily. 90 tablet 1   Multiple Vitamin (MULTIVITAMIN) tablet Take 1 tablet by mouth daily.     polyethylene glycol (MIRALAX / GLYCOLAX) 17 g packet Take 17 g by mouth 2 (two) times daily. 14 each 0   No current facility-administered medications for this visit.   No results found.  Review of Systems:   A ROS was  performed including pertinent positives and negatives as documented in the HPI.   Musculoskeletal Exam:    There were no vitals taken for this visit.    Left lower extremity knee range of motion is from 0 to 130  degrees.  Incisions are well-healed. Sensation is intact in all distributions fires EHL tibialis anterior as well as 2+ dorsalis pedis pulse  Imaging:    2 views left tib-fib: Status post intramedullary nailing without evidence of complication is some evidence of early callus formation  I personally reviewed and interpreted the radiographs.   Assessment:   12 weeks status post intramedullary nailing of the left tibia without complication.  X-rays today do show increasing callus formation.  He will continue to weight-bear as tolerated and progress to strengthening.  He may return to work on [employer] duty.  I will plan to see him back in 8 weeks for reassessment Plan :    -Return to clinic 12 weeks      I personally saw and evaluated the patient, and participated in the management and treatment plan.  Huel Cote, MD Attending Physician, Orthopedic Surgery  This document was dictated using Dragon voice recognition software. A reasonable attempt at proof reading has been made to minimize errors.
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
## 2022-07-05 ENCOUNTER — Ambulatory Visit (HOSPITAL_BASED_OUTPATIENT_CLINIC_OR_DEPARTMENT_OTHER): Payer: BC Managed Care – PPO

## 2022-07-05 ENCOUNTER — Encounter (HOSPITAL_BASED_OUTPATIENT_CLINIC_OR_DEPARTMENT_OTHER): Payer: Self-pay

## 2022-07-05 DIAGNOSIS — M25512 Pain in left shoulder: Secondary | ICD-10-CM | POA: Diagnosis not present

## 2022-07-05 DIAGNOSIS — R262 Difficulty in walking, not elsewhere classified: Secondary | ICD-10-CM | POA: Diagnosis not present

## 2022-07-05 DIAGNOSIS — M25662 Stiffness of left knee, not elsewhere classified: Secondary | ICD-10-CM

## 2022-07-05 DIAGNOSIS — M25562 Pain in left knee: Secondary | ICD-10-CM | POA: Diagnosis not present

## 2022-07-05 DIAGNOSIS — M25612 Stiffness of left shoulder, not elsewhere classified: Secondary | ICD-10-CM | POA: Diagnosis not present

## 2022-07-05 DIAGNOSIS — M6281 Muscle weakness (generalized): Secondary | ICD-10-CM
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
## 2022-07-26 ENCOUNTER — Ambulatory Visit (INDEPENDENT_AMBULATORY_CARE_PROVIDER_SITE_OTHER): Payer: BC Managed Care – PPO | Admitting: Internal Medicine

## 2022-07-26 ENCOUNTER — Encounter: Payer: Self-pay | Admitting: Internal Medicine

## 2022-07-26 ENCOUNTER — Other Ambulatory Visit: Payer: Self-pay | Admitting: Internal Medicine

## 2022-07-26 VITALS — BP 126/84 | HR 65 | Ht 70.98 in | Wt 178.0 lb

## 2022-07-26 DIAGNOSIS — E559 Vitamin D deficiency, unspecified: Secondary | ICD-10-CM

## 2022-07-26 DIAGNOSIS — E0590 Thyrotoxicosis, unspecified without thyrotoxic crisis or storm: Secondary | ICD-10-CM

## 2022-07-26 NOTE — Patient Instructions (Signed)
Please have labs done at [facility] :   Address : [address], [address], [place], [place] [address] Phone [phone]  
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
## 2022-07-27 LAB — TSH: TSH: 1.11 u[IU]/mL (ref 0.450–4.500)

## 2022-07-27 LAB — VITAMIN D 25 HYDROXY (VIT D DEFICIENCY, FRACTURES): Vit D, 25-Hydroxy: 52.1 ng/mL (ref 30.0–100.0)

## 2022-07-27 LAB — T4, FREE: Free T4: 1.01 ng/dL (ref 0.82–1.77)

## 2022-07-29 MED ORDER — METHIMAZOLE 5 MG PO TABS: 5.0000 mg | ORAL_TABLET | Freq: Every day | ORAL | 3 refills | Status: DC

## 2022-08-23 ENCOUNTER — Ambulatory Visit
Admission: RE | Admit: 2022-08-23 | Discharge: 2022-08-23 | Disposition: A | Discharge status: Home / Self Care | Payer: BC Managed Care – PPO | Source: Ambulatory Visit | Attending: Internal Medicine | Admitting: Internal Medicine

## 2022-08-23 DIAGNOSIS — E0590 Thyrotoxicosis, unspecified without thyrotoxic crisis or storm: Secondary | ICD-10-CM

## 2022-10-25 ENCOUNTER — Ambulatory Visit (INDEPENDENT_AMBULATORY_CARE_PROVIDER_SITE_OTHER): Payer: BC Managed Care – PPO

## 2022-10-25 ENCOUNTER — Ambulatory Visit (HOSPITAL_BASED_OUTPATIENT_CLINIC_OR_DEPARTMENT_OTHER): Payer: BC Managed Care – PPO | Admitting: Orthopaedic Surgery

## 2022-10-25 DIAGNOSIS — S82252A Displaced comminuted fracture of shaft of left tibia, initial encounter for closed fracture: Secondary | ICD-10-CM | POA: Diagnosis not present

## 2022-10-25 DIAGNOSIS — S82102D Unspecified fracture of upper end of left tibia, subsequent encounter for closed fracture with routine healing: Secondary | ICD-10-CM | POA: Diagnosis not present

## 2022-10-25 DIAGNOSIS — S82402D Unspecified fracture of shaft of left fibula, subsequent encounter for closed fracture with routine healing: Secondary | ICD-10-CM | POA: Diagnosis not present

## 2022-10-25 DIAGNOSIS — S82302D Unspecified fracture of lower end of left tibia, subsequent encounter for closed fracture with routine healing: Secondary | ICD-10-CM | POA: Diagnosis not present

## 2022-10-25 NOTE — Progress Notes (Signed)
Post Operative Evaluation    Procedure/Date of Surgery: Left tibial nail, bilateral scapular fractures  Interval History:   10/25/2022: Presents today for follow-up of the left tibial nail.  Overall he states that he did have a recent episode of feeling more pain and since this time this has been getting better.  Presents today for follow-up of his left tibia fracture with intramedullary nailing.  He is doing very well.  He is walking with a walker.  Overall the right shoulder he has been able to reestablish normal range of motion of the left shoulder is significantly painful.  He has very limited range of motion about the left shoulder at today's visit.   PMH/PSH/Family History/Social History/Meds/Allergies:    Past Medical History:  Diagnosis Date   Hypothyroidism    Past Surgical History:  Procedure Laterality Date   TIBIA IM NAIL INSERTION Left 04/15/2022   Procedure: INTRAMEDULLARY (IM) NAIL TIBIAL;  Surgeon: Huel Cote, MD;  Location: [facility] OR;  Service: Orthopedics;  Laterality: Left;   VASECTOMY  2019   Social History   Socioeconomic History   Marital status: Married    Spouse name: Not on file   Number of children: 2   Years of education: Not on file   Highest education level: Not on file  Occupational History   Not on file  Tobacco Use   Smoking status: Never   Smokeless tobacco: Never  Vaping Use   Vaping status: Never Used  Substance and Sexual Activity   Alcohol use: Yes    Alcohol/week: 2.0 - 4.0 standard drinks of alcohol    Types: 2 - 4 Shots of liquor per week    Comment: Occasional   Drug use: Yes    Types: Marijuana   Sexual activity: Yes    Birth control/protection: Surgical    Comment: vasectomy  Other Topics Concern   Not on file  Social History Narrative   Door assembly   Social Determinants of Health   Financial Resource Strain: Not on file  Food Insecurity: No Food Insecurity (04/16/2022)   Hunger  Vital Sign    Worried About Running Out of Food in the Last Year: Never true    Ran Out of Food in the Last Year: Never true  Transportation Needs: No Transportation Needs (04/16/2022)   PRAPARE - Administrator, Civil Service (Medical): No    Lack of Transportation (Non-Medical): No  Physical Activity: Not on file  Stress: Not on file  Social Connections: Not on file   Family History  Problem Relation Age of Onset   Hypertension Mother    Diabetes Mother    Alcohol abuse Maternal Grandmother    Hypertension Maternal Grandmother    Diabetes Maternal Grandmother    No Known Allergies Current Outpatient Medications  Medication Sig Dispense Refill   acetaminophen (TYLENOL) 325 MG tablet Take 2 tablets (650 mg total) by mouth every 6 (six) hours as needed.     aspirin EC 325 MG tablet Take 1 tablet (325 mg total) by mouth daily. 30 tablet 11   docusate sodium (COLACE) 100 MG capsule Take 1 capsule (100 mg total) by mouth 2 (two) times daily. 10 capsule 0   gabapentin (NEURONTIN) 300 MG capsule Take 1 capsule (300 mg total) by mouth 3 (three) times daily. (  Patient not taking: Reported on 07/26/2022) 90 capsule 0   methimazole (TAPAZOLE) 5 MG tablet Take 1 tablet (5 mg total) by mouth daily. 90 tablet 3   Multiple Vitamin (MULTIVITAMIN) tablet Take 1 tablet by mouth daily.     polyethylene glycol (MIRALAX / GLYCOLAX) 17 g packet Take 17 g by mouth 2 (two) times daily. 14 each 0   No current facility-administered medications for this visit.   No results found.  Review of Systems:   A ROS was performed including pertinent positives and negatives as documented in the HPI.   Musculoskeletal Exam:    There were no vitals taken for this visit.    Left lower extremity knee range of motion is from 0 to 130  degrees.  Incisions are well-healed. Sensation is intact in all distributions fires EHL tibialis anterior as well as 2+ dorsalis pedis pulse  Imaging:    2 views left  tib-fib: Status post intramedullary nailing with distal broken interlock screws consistent with self dynamization and subsequent callus remission  I personally reviewed and interpreted the radiographs.   Assessment:   6 months status post intramedullary nailing of the left tibia without complication.  I did describe that his nail has dynamized with 2 distal broken screws.  At this time he is having increasing callus remission which I do believe is a result of the dynamization.  Given this I will plan to continue to follow him for an additional 3 months. Plan :    -Return to clinic 12 weeks      I personally saw and evaluated the patient, and participated in the management and treatment plan.  Huel Cote, MD Attending Physician, Orthopedic Surgery  This document was dictated using Dragon voice recognition software. A reasonable attempt at proof reading has been made to minimize errors.

## 2022-12-02 ENCOUNTER — Encounter (HOSPITAL_BASED_OUTPATIENT_CLINIC_OR_DEPARTMENT_OTHER): Payer: Self-pay | Admitting: Orthopaedic Surgery

## 2022-12-02 ENCOUNTER — Other Ambulatory Visit (HOSPITAL_BASED_OUTPATIENT_CLINIC_OR_DEPARTMENT_OTHER): Payer: Self-pay | Admitting: Orthopaedic Surgery

## 2022-12-02 MED ORDER — IBUPROFEN 800 MG PO TABS: 800.0000 mg | ORAL_TABLET | Freq: Three times a day (TID) | ORAL | 0 refills | Status: DC | PRN

## 2023-01-31 ENCOUNTER — Ambulatory Visit (INDEPENDENT_AMBULATORY_CARE_PROVIDER_SITE_OTHER): Payer: BC Managed Care – PPO | Admitting: Internal Medicine

## 2023-01-31 ENCOUNTER — Encounter: Payer: Self-pay | Admitting: Internal Medicine

## 2023-01-31 VITALS — BP 120/72 | HR 80 | Ht 70.0 in | Wt 174.0 lb

## 2023-01-31 DIAGNOSIS — E559 Vitamin D deficiency, unspecified: Secondary | ICD-10-CM | POA: Diagnosis not present

## 2023-01-31 DIAGNOSIS — E0590 Thyrotoxicosis, unspecified without thyrotoxic crisis or storm: Secondary | ICD-10-CM
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
## 2023-02-01 LAB — VITAMIN D 25 HYDROXY (VIT D DEFICIENCY, FRACTURES): Vit D, 25-Hydroxy: 48 ng/mL (ref 30–100)

## 2023-02-01 LAB — TSH: TSH: 0.12 m[IU]/L — ABNORMAL LOW (ref 0.40–4.50)

## 2023-02-01 LAB — T4, FREE: Free T4: 1.1 ng/dL (ref 0.8–1.8)

## 2023-02-03 ENCOUNTER — Other Ambulatory Visit: Payer: Self-pay

## 2023-02-03 ENCOUNTER — Telehealth: Payer: Self-pay | Admitting: Internal Medicine

## 2023-02-03 MED ORDER — METHIMAZOLE 5 MG PO TABS: 5.0000 mg | ORAL_TABLET | Freq: Every day | ORAL | 3 refills | Status: DC

## 2023-02-03 MED ORDER — METHIMAZOLE 5 MG PO TABS: 5.0000 mg | ORAL_TABLET | Freq: Every day | ORAL | 3 refills | Status: AC

## 2023-02-03 NOTE — Telephone Encounter (Signed)
Please let the patient know that his thyroid remains overactive and I would suggest that he restart on the methimazole.   Please let the patient know that usually prescriptions written for a year supply and he needs to make sure to check with the pharmacy in the future for any refills  Vitamin D is normal

## 2023-02-03 NOTE — Telephone Encounter (Signed)
Patient aware and will restart medication

## 2023-02-20 ENCOUNTER — Encounter: Payer: BC Managed Care – PPO | Admitting: Family Medicine

## 2023-02-28 ENCOUNTER — Encounter: Payer: Self-pay | Admitting: Family Medicine

## 2023-02-28 ENCOUNTER — Ambulatory Visit (INDEPENDENT_AMBULATORY_CARE_PROVIDER_SITE_OTHER): Payer: BC Managed Care – PPO | Admitting: Family Medicine

## 2023-02-28 VITALS — BP 112/62 | HR 64 | Temp 97.9°F | Resp 18 | Ht 69.5 in | Wt 177.0 lb

## 2023-02-28 DIAGNOSIS — Z1159 Encounter for screening for other viral diseases: Secondary | ICD-10-CM

## 2023-02-28 DIAGNOSIS — Z0000 Encounter for general adult medical examination without abnormal findings: Secondary | ICD-10-CM

## 2023-02-28 NOTE — Patient Instructions (Signed)
It was very nice to see you today!  [facility]  [address] on the 1st floor Phone number [phone]  Custom orthotics.   Podiatrist.   PLEASE NOTE:  If you had any lab tests please let us know if you have not heard back within a few days. You may see your results on MyChart before we have a chance to review them but we will give you a call once they are reviewed by [place]. If we ordered any referrals today, please let us know if you have not heard from their office within the next week.   Please try these tips to maintain a healthy lifestyle:  Eat most of your calories during the day when you are active. Eliminate processed foods including packaged sweets (pies, cakes, cookies), reduce intake of potatoes, white bread, white pasta, and white rice. Look for whole grain options, oat flour or almond flour.  Each meal should contain half fruits/vegetables, one quarter protein, and one quarter carbs (no bigger than a computer mouse).  Cut down on sweet beverages. This includes juice, soda, and sweet tea. Also watch fruit intake, though this is a healthier sweet option, it still contains natural sugar! Limit to 3 servings daily.  Drink at least 1 glass of water with each meal and aim for at least 8 glasses per day  Exercise at least 150 minutes every week.
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
## 2023-03-01 LAB — HEMOGLOBIN A1C
Hgb A1c MFr Bld: 5.6 %{Hb} (ref <5.7)
Mean Plasma Glucose: 114 mg/dL
eAG (mmol/L): 6.3 mmol/L

## 2023-03-01 LAB — LIPID PANEL
Cholesterol: 184 mg/dL (ref <200)
HDL: 53 mg/dL (ref >=40)
LDL Cholesterol (Calc): 104 mg/dL — ABNORMAL HIGH
Non-HDL Cholesterol (Calc): 131 mg/dL — ABNORMAL HIGH (ref <130)
Total CHOL/HDL Ratio: 3.5 (calc) (ref <5.0)
Triglycerides: 159 mg/dL — ABNORMAL HIGH (ref <150)

## 2023-03-01 LAB — COMPREHENSIVE METABOLIC PANEL
AG Ratio: 1.4 (calc) (ref 1.0–2.5)
ALT: 14 U/L (ref 9–46)
AST: 15 U/L (ref 10–40)
Albumin: 4.2 g/dL (ref 3.6–5.1)
Alkaline phosphatase (APISO): 103 U/L (ref 36–130)
BUN: 10 mg/dL (ref 7–25)
CO2: 24 mmol/L (ref 20–32)
Calcium: 9.3 mg/dL (ref 8.6–10.3)
Chloride: 101 mmol/L (ref 98–110)
Creat: 0.92 mg/dL (ref 0.60–1.26)
Globulin: 3 g/dL (ref 1.9–3.7)
Glucose, Bld: 81 mg/dL (ref 65–99)
Potassium: 4 mmol/L (ref 3.5–5.3)
Sodium: 138 mmol/L (ref 135–146)
Total Bilirubin: 0.4 mg/dL (ref 0.2–1.2)
Total Protein: 7.2 g/dL (ref 6.1–8.1)

## 2023-03-01 LAB — HEPATITIS C ANTIBODY: Hepatitis C Ab: NONREACTIVE

## 2023-03-01 LAB — CBC WITH DIFFERENTIAL/PLATELET
Absolute Lymphocytes: 1574 {cells}/uL (ref 850–3900)
Absolute Monocytes: 295 {cells}/uL (ref 200–950)
Basophils Absolute: 41 {cells}/uL (ref 0–200)
Basophils Relative: 1 %
Eosinophils Absolute: 57 {cells}/uL (ref 15–500)
Eosinophils Relative: 1.4 %
HCT: 39.6 % (ref 38.5–50.0)
Hemoglobin: 13.4 g/dL (ref 13.2–17.1)
MCH: 28.5 pg (ref 27.0–33.0)
MCHC: 33.8 g/dL (ref 32.0–36.0)
MCV: 84.1 fL (ref 80.0–100.0)
MPV: 10 fL (ref 7.5–12.5)
Monocytes Relative: 7.2 %
Neutro Abs: 2132 {cells}/uL (ref 1500–7800)
Neutrophils Relative %: 52 %
Platelets: 254 10*3/uL (ref 140–400)
RBC: 4.71 10*6/uL (ref 4.20–5.80)
RDW: 13 % (ref 11.0–15.0)
Total Lymphocyte: 38.4 %
WBC: 4.1 10*3/uL (ref 3.8–10.8)

## 2023-03-02 ENCOUNTER — Encounter: Payer: Self-pay | Admitting: Family Medicine

## 2023-03-02 NOTE — Progress Notes (Signed)
Labs are great

## 2023-03-19 ENCOUNTER — Ambulatory Visit (HOSPITAL_BASED_OUTPATIENT_CLINIC_OR_DEPARTMENT_OTHER): Payer: BC Managed Care – PPO | Admitting: Orthopaedic Surgery

## 2023-03-19 ENCOUNTER — Ambulatory Visit (HOSPITAL_BASED_OUTPATIENT_CLINIC_OR_DEPARTMENT_OTHER): Payer: BC Managed Care – PPO

## 2023-03-19 ENCOUNTER — Other Ambulatory Visit (HOSPITAL_BASED_OUTPATIENT_CLINIC_OR_DEPARTMENT_OTHER): Payer: Self-pay

## 2023-03-19 DIAGNOSIS — S82252A Displaced comminuted fracture of shaft of left tibia, initial encounter for closed fracture: Secondary | ICD-10-CM | POA: Diagnosis not present

## 2023-03-19 DIAGNOSIS — S82102D Unspecified fracture of upper end of left tibia, subsequent encounter for closed fracture with routine healing: Secondary | ICD-10-CM | POA: Diagnosis not present

## 2023-03-19 DIAGNOSIS — S82402D Unspecified fracture of shaft of left fibula, subsequent encounter for closed fracture with routine healing: Secondary | ICD-10-CM | POA: Diagnosis not present

## 2023-03-19 MED ORDER — MELOXICAM 15 MG PO TABS
15.0000 mg | ORAL_TABLET | Freq: Every day | ORAL | 2 refills | Status: DC
  Filled 2023-03-19: qty 30, 30d supply, fill #0

## 2023-03-19 NOTE — Progress Notes (Signed)
 Post Operative Evaluation    Procedure/Date of Surgery: Left tibial nail, bilateral scapular fractures  Interval History:   03/19/2023: Today for follow-up of the left proximal tibia fracture.  He has been able to return to walk as well as to work although he does occasionally use a cane  Presents today for follow-up of his left tibia fracture with intramedullary nailing.  He is doing very well.  He is walking with a walker.  Overall the right shoulder he has been able to reestablish normal range of motion of the left shoulder is significantly painful.  He has very limited range of motion about the left shoulder at today's visit.   PMH/PSH/Family History/Social History/Meds/Allergies:    Past Medical History:  Diagnosis Date   Hyperthyroidism    Past Surgical History:  Procedure Laterality Date   TIBIA IM NAIL INSERTION Left 04/15/2022   Procedure: INTRAMEDULLARY (IM) NAIL TIBIAL;  Surgeon: Genelle Standing, MD;  Location: [facility] OR;  Service: Orthopedics;  Laterality: Left;   VASECTOMY  2019   Social History   Socioeconomic History   Marital status: Married    Spouse name: Not on file   Number of children: 2   Years of education: Not on file   Highest education level: Not on file  Occupational History   Not on file  Tobacco Use   Smoking status: Never   Smokeless tobacco: Never  Vaping Use   Vaping status: Never Used  Substance and Sexual Activity   Alcohol use: Yes    Alcohol/week: 2.0 - 4.0 standard drinks of alcohol    Types: 2 - 4 Shots of liquor per week    Comment: Occasional   Drug use: Yes    Types: Marijuana   Sexual activity: Yes    Birth control/protection: Surgical    Comment: vasectomy  Other Topics Concern   Not on file  Social History Narrative   Door assembly   Social Drivers of Health   Financial Resource Strain: Not on file  Food Insecurity: No Food Insecurity (04/16/2022)   Hunger Vital Sign    Worried About  Running Out of Food in the Last Year: Never true    Ran Out of Food in the Last Year: Never true  Transportation Needs: No Transportation Needs (04/16/2022)   PRAPARE - Administrator, Civil Service (Medical): No    Lack of Transportation (Non-Medical): No  Physical Activity: Not on file  Stress: Not on file  Social Connections: Not on file   Family History  Problem Relation Age of Onset   Hypertension Mother    Diabetes Mother    Alcohol abuse Maternal Grandmother    Hypertension Maternal Grandmother    Diabetes Maternal Grandmother    No Known Allergies Current Outpatient Medications  Medication Sig Dispense Refill   meloxicam  (MOBIC ) 15 MG tablet Take 1 tablet (15 mg total) by mouth daily. 30 tablet 2   methimazole  (TAPAZOLE ) 5 MG tablet Take 1 tablet (5 mg total) by mouth daily. 90 tablet 3   Multiple Vitamin (MULTIVITAMIN) tablet Take 1 tablet by mouth daily.     No current facility-administered medications for this visit.   No results found.  Review of Systems:   A ROS was performed including pertinent positives and negatives as documented in the HPI.  Musculoskeletal Exam:    There were no vitals taken for this visit.    Left lower extremity knee range of motion is from 0 to 130  degrees.  Incisions are well-healed. Sensation is intact in all distributions fires EHL tibialis anterior as well as 2+ dorsalis pedis pulse  Imaging:    2 views left tib-fib: Status post intramedullary nailing with distal broken interlock screws consistent with self dynamization and subsequent significant callus I personally reviewed and interpreted the radiographs.   Assessment:   12 months status post intramedullary nailing of the left tibia without complication.  Overall he does have very significant increased callus formation since last x-ray.  I would like to see him back in 4 months for reassessment and likely final check Plan :    -Return to clinic for final  assessment in 4 months      I personally saw and evaluated the patient, and participated in the management and treatment plan.  Elspeth Parker, MD Attending Physician, Orthopedic Surgery  This document was dictated using Dragon voice recognition software. A reasonable attempt at proof reading has been made to minimize errors.

## 2023-03-22 ENCOUNTER — Other Ambulatory Visit (HOSPITAL_BASED_OUTPATIENT_CLINIC_OR_DEPARTMENT_OTHER): Payer: Self-pay

## 2023-03-24 ENCOUNTER — Other Ambulatory Visit (HOSPITAL_BASED_OUTPATIENT_CLINIC_OR_DEPARTMENT_OTHER): Payer: Self-pay

## 2023-06-06 ENCOUNTER — Ambulatory Visit: Payer: BC Managed Care – PPO | Admitting: Internal Medicine

## 2023-06-19 ENCOUNTER — Other Ambulatory Visit (HOSPITAL_BASED_OUTPATIENT_CLINIC_OR_DEPARTMENT_OTHER): Payer: Self-pay | Admitting: Orthopaedic Surgery

## 2023-08-14 ENCOUNTER — Emergency Department (HOSPITAL_COMMUNITY)
Admission: EM | Admit: 2023-08-14 | Discharge: 2023-08-14 | Disposition: A | Discharge status: Home / Self Care | Attending: Emergency Medicine | Admitting: Emergency Medicine

## 2023-08-14 ENCOUNTER — Other Ambulatory Visit: Payer: Self-pay

## 2023-08-14 ENCOUNTER — Encounter (HOSPITAL_COMMUNITY): Payer: Self-pay

## 2023-08-14 ENCOUNTER — Emergency Department (HOSPITAL_COMMUNITY)

## 2023-08-14 DIAGNOSIS — N202 Calculus of kidney with calculus of ureter: Secondary | ICD-10-CM | POA: Diagnosis not present

## 2023-08-14 DIAGNOSIS — N200 Calculus of kidney: Secondary | ICD-10-CM | POA: Diagnosis not present

## 2023-08-14 DIAGNOSIS — N132 Hydronephrosis with renal and ureteral calculous obstruction: Secondary | ICD-10-CM | POA: Insufficient documentation

## 2023-08-14 DIAGNOSIS — N2882 Megaloureter: Secondary | ICD-10-CM | POA: Diagnosis not present

## 2023-08-14 DIAGNOSIS — R109 Unspecified abdominal pain: Secondary | ICD-10-CM | POA: Diagnosis not present

## 2023-08-14 LAB — COMPREHENSIVE METABOLIC PANEL WITH GFR
ALT: 17 U/L (ref 0–44)
AST: 21 U/L (ref 15–41)
Albumin: 3.7 g/dL (ref 3.5–5.0)
Alkaline Phosphatase: 83 U/L (ref 38–126)
Anion gap: 9 (ref 5–15)
BUN: 13 mg/dL (ref 6–20)
CO2: 26 mmol/L (ref 22–32)
Calcium: 9.4 mg/dL (ref 8.9–10.3)
Chloride: 104 mmol/L (ref 98–111)
Creatinine, Ser: 0.99 mg/dL (ref 0.61–1.24)
GFR, Estimated: >60 mL/min (ref >60)
Glucose, Bld: 105 mg/dL — ABNORMAL HIGH (ref 70–99)
Potassium: 3.6 mmol/L (ref 3.5–5.1)
Sodium: 139 mmol/L (ref 135–145)
Total Bilirubin: 0.7 mg/dL (ref 0.0–1.2)
Total Protein: 7.1 g/dL (ref 6.5–8.1)

## 2023-08-14 LAB — CBC WITH DIFFERENTIAL/PLATELET
Abs Immature Granulocytes: 0 10*3/uL (ref 0.00–0.07)
Basophils Absolute: 0 10*3/uL (ref 0.0–0.1)
Basophils Relative: 0 %
Eosinophils Absolute: 0 10*3/uL (ref 0.0–0.5)
Eosinophils Relative: 1 %
HCT: 41.2 % (ref 39.0–52.0)
Hemoglobin: 13.6 g/dL (ref 13.0–17.0)
Immature Granulocytes: 0 %
Lymphocytes Relative: 24 %
Lymphs Abs: 1.1 10*3/uL (ref 0.7–4.0)
MCH: 29 pg (ref 26.0–34.0)
MCHC: 33 g/dL (ref 30.0–36.0)
MCV: 87.8 fL (ref 80.0–100.0)
Monocytes Absolute: 0.5 10*3/uL (ref 0.1–1.0)
Monocytes Relative: 11 %
Neutro Abs: 2.9 10*3/uL (ref 1.7–7.7)
Neutrophils Relative %: 64 %
Platelets: 205 10*3/uL (ref 150–400)
RBC: 4.69 MIL/uL (ref 4.22–5.81)
RDW: 13.3 % (ref 11.5–15.5)
WBC: 4.5 10*3/uL (ref 4.0–10.5)
nRBC: 0 % (ref 0.0–0.2)

## 2023-08-14 LAB — URINALYSIS, MICROSCOPIC (REFLEX)

## 2023-08-14 LAB — URINALYSIS, ROUTINE W REFLEX MICROSCOPIC
Glucose, UA: 100 mg/dL — AB
Ketones, ur: NEGATIVE mg/dL
Leukocytes,Ua: NEGATIVE
Nitrite: POSITIVE — AB
Protein, ur: 30 mg/dL — AB
Specific Gravity, Urine: 1.02 (ref 1.005–1.030)
pH: 5 (ref 5.0–8.0)

## 2023-08-14 LAB — CK: Total CK: 189 U/L (ref 49–397)

## 2023-08-14 MED ORDER — ONDANSETRON 4 MG PO TBDP: 4.0000 mg | ORAL_TABLET | Freq: Three times a day (TID) | ORAL | 0 refills | Status: DC | PRN

## 2023-08-14 MED ORDER — TAMSULOSIN HCL 0.4 MG PO CAPS
0.4000 mg | ORAL_CAPSULE | Freq: Once | ORAL | Status: AC
  Administered 2023-08-14: 0.4 mg via ORAL
  Filled 2023-08-14: qty 1

## 2023-08-14 MED ORDER — CEPHALEXIN 500 MG PO CAPS: 500.0000 mg | ORAL_CAPSULE | Freq: Four times a day (QID) | ORAL | 0 refills | Status: AC

## 2023-08-14 MED ORDER — CEPHALEXIN 250 MG PO CAPS
1000.0000 mg | ORAL_CAPSULE | Freq: Once | ORAL | Status: AC
  Administered 2023-08-14: 1000 mg via ORAL
  Filled 2023-08-14: qty 4

## 2023-08-14 MED ORDER — IBUPROFEN 800 MG PO TABS: 800.0000 mg | ORAL_TABLET | Freq: Three times a day (TID) | ORAL | 0 refills | Status: AC

## 2023-08-14 MED ORDER — TAMSULOSIN HCL 0.4 MG PO CAPS: 0.4000 mg | ORAL_CAPSULE | Freq: Every day | ORAL | 0 refills | Status: DC

## 2023-08-14 MED ORDER — OXYCODONE-ACETAMINOPHEN 5-325 MG PO TABS: 2.0000 | ORAL_TABLET | ORAL | 0 refills | Status: AC | PRN

## 2023-08-14 NOTE — ED Triage Notes (Signed)
 Right sided abdominal pain / flank pain, and urinary frequency for several days.   Took a [facility] test which turned purple signaling a uti. Took several pills and had minimal relief but sx returned.

## 2023-08-14 NOTE — ED Notes (Signed)
 Pt understood d/c instructions and when to return to the ED. Medications reviewed and understood. IV removed and VS retaken. Pt ;eft w/ significant other to go home

## 2023-08-15 LAB — URINE CULTURE: Culture: NO GROWTH

## 2023-08-15 NOTE — ED Provider Notes (Signed)
 [facility] Provider Note   CSN: [id number] Arrival date & time: 08/14/23  0251     History  Chief Complaint  Patient presents with   Flank Pain    Alan Watkins is a 38 y.o. male.  38 yo M here with increased urinary frequency throughout the day today. Took a home UTI test taht read positive so took some medicine that was in the box. Some RLQ pain as well. No radaition to the back but did have back pain a couple days ago.    Flank Pain       Home Medications Prior to Admission medications   Medication Sig Start Date End Date Taking? Authorizing Provider  cephALEXin (KEFLEX) 500 MG capsule Take 1 capsule (500 mg total) by mouth 4 (four) times daily. 08/14/23  Yes Atilla Zollner, Reymundo Caulk, MD  ibuprofen  (ADVIL ) 800 MG tablet Take 1 tablet (800 mg total) by mouth 3 (three) times daily. 08/14/23  Yes Aquarius Tremper, Reymundo Caulk, MD  ondansetron  (ZOFRAN -ODT) 4 MG disintegrating tablet Take 1 tablet (4 mg total) by mouth every 8 (eight) hours as needed for vomiting. 08/14/23  Yes Jacia Sickman, Reymundo Caulk, MD  oxyCODONE -acetaminophen  (PERCOCET) 5-325 MG tablet Take 2 tablets by mouth every 4 (four) hours as needed. 08/14/23  Yes Marny Smethers, Reymundo Caulk, MD  tamsulosin (FLOMAX) 0.4 MG CAPS capsule Take 1 capsule (0.4 mg total) by mouth daily. Until stone passes 08/14/23  Yes Tayvia Faughnan, Reymundo Caulk, MD  meloxicam  (MOBIC ) 15 MG tablet TAKE 1 TABLET BY MOUTH EVERY DAY 06/19/23   Wilhelmenia Harada, MD  methimazole  (TAPAZOLE ) 5 MG tablet Take 1 tablet (5 mg total) by mouth daily. 02/03/23   Shamleffer, Ibtehal Jaralla, MD  Multiple Vitamin (MULTIVITAMIN) tablet Take 1 tablet by mouth daily.    [provider]      Allergies    Patient has no known allergies.    Review of Systems   Review of Systems  Genitourinary:  Positive for flank pain.    Physical Exam Updated Vital Signs BP 115/79   Pulse 61   Temp 98 F (36.7 C)   Resp 16   Ht 5\' 10"  (1.778 m)   Wt 77.1 kg   SpO2 100%   BMI  24.39 kg/m  Physical Exam Vitals and nursing note reviewed.  Constitutional:      Appearance: He is well-developed.  HENT:     Head: Normocephalic and atraumatic.  Cardiovascular:     Rate and Rhythm: Normal rate.  Pulmonary:     Effort: Pulmonary effort is normal. No respiratory distress.  Abdominal:     General: There is no distension.  Musculoskeletal:        General: Normal range of motion.     Cervical back: Normal range of motion.  Neurological:     Mental Status: He is alert.     ED Results / Procedures / Treatments   Labs (all labs ordered are listed, but only abnormal results are displayed) Labs Reviewed  URINALYSIS, ROUTINE W REFLEX MICROSCOPIC - Abnormal; Notable for the following components:      Result Value   Color, Urine ORANGE (*)    Glucose, UA 100 (*)    Hgb urine dipstick TRACE (*)    Bilirubin Urine MODERATE (*)    Protein, ur 30 (*)    Nitrite POSITIVE (*)    All other components within normal limits  URINALYSIS, MICROSCOPIC (REFLEX) - Abnormal; Notable for the following components:   Bacteria, UA RARE (*)  All other components within normal limits  COMPREHENSIVE METABOLIC PANEL WITH GFR - Abnormal; Notable for the following components:   Glucose, Bld 105 (*)    All other components within normal limits  URINE CULTURE  CBC WITH DIFFERENTIAL/PLATELET  CK    EKG None  Radiology CT Renal Stone Study Result Date: 08/14/2023 EXAM: CT ABDOMEN AND PELVIS WITHOUT CONTRAST 08/14/2023 06:17:22 AM TECHNIQUE: CT of the abdomen and pelvis was performed without the administration of intravenous contrast. Multiplanar reformatted images are provided for review. Automated exposure control, iterative reconstruction, and/or weight based adjustment of the mA/kV was utilized to reduce the radiation dose to as low as reasonably achievable. COMPARISON: CT of the chest, abdomen, and pelvis 04/13/2022. CLINICAL HISTORY: Abdominal/flank pain, stone suspected. Right  sided abdominal pain / flank pain, and urinary frequency for several days. Took a [facility] test which turned purple signaling a UTI. Took several pills and had minimal relief but symptoms returned. FINDINGS: LOWER CHEST: No acute abnormality. LIVER: The liver is unremarkable. GALLBLADDER AND BILE DUCTS: Gallbladder is unremarkable. No biliary ductal dilatation. SPLEEN: No acute abnormality. PANCREAS: No acute abnormality. ADRENAL GLANDS: No acute abnormality. KIDNEYS, URETERS AND BLADDER: A 2 mm obstructing stone is present in the distal right ureter, just above the ureterovesical junction. A punctate nonobstructing stone is present at the lower pole of the right kidney. The right ureter is dilated with some inflammatory change. No significant right-sided hydronephrosis is present. At least 6 punctate nonobstructing stones are present in the left kidney, most prominently at the lower pole. No obstruction is present. The left ureter is within normal limits. GI AND BOWEL: Stomach demonstrates no acute abnormality. There is no bowel obstruction. No appendicitis. PERITONEUM AND RETROPERITONEUM: No ascites. No free air. VASCULATURE: Aorta is normal in caliber. LYMPH NODES: No lymphadenopathy. REPRODUCTIVE ORGANS: No acute abnormality. BONES AND SOFT TISSUES: No acute osseous abnormality. No focal soft tissue abnormality. IMPRESSION: 1. 2 mm obstructing stone in the distal right ureter with associated ureteral dilation and inflammatory change. No significant right-sided hydronephrosis. 2. Punctate nonobstructing stone in the lower pole of the right kidney. 3. At least 6 punctate nonobstructing stones in the left kidney, most prominently at the lower pole. No obstruction. Electronically signed by: Audree Leas MD 08/14/2023 06:44 AM EDT RP Workstation: [id number]    Procedures Procedures    Medications Ordered in ED Medications  cephALEXin (KEFLEX) capsule 1,000 mg (1,000 mg Oral Given 08/14/23 0733)  tamsulosin  (FLOMAX) capsule 0.4 mg (0.4 mg Oral Given 08/14/23 0734)    ED Course/ Medical Decision Making/ A&P                                 Medical Decision Making Amount and/or Complexity of Data Reviewed Labs: ordered. Radiology: ordered.  Risk Prescription drug management.   Suspect his symptoms are mostly from kidney stone. It is near bladder, pain controlled no indication for admission or emergent urology consultation. UA possibly? Infected but suspect it is contaminated with pyridium as patient took whatever medicine was in the box of the UTI test and his urine turned orange afterwards. Will start antibiotics just in case but will send culture.    Final Clinical Impression(s) / ED Diagnoses Final diagnoses:  Nephrolithiasis    Rx / DC Orders ED Discharge Orders          Ordered    cephALEXin (KEFLEX) 500 MG capsule  4 times daily  08/14/23 0724    tamsulosin (FLOMAX) 0.4 MG CAPS capsule  Daily        08/14/23 0724    ibuprofen  (ADVIL ) 800 MG tablet  3 times daily        08/14/23 0724    ondansetron  (ZOFRAN -ODT) 4 MG disintegrating tablet  Every 8 hours PRN        08/14/23 0724    oxyCODONE -acetaminophen  (PERCOCET) 5-325 MG tablet  Every 4 hours PRN        08/14/23 0724              Mikisha Roseland, MD 08/15/23 [id number]

## 2023-09-05 ENCOUNTER — Ambulatory Visit (HOSPITAL_BASED_OUTPATIENT_CLINIC_OR_DEPARTMENT_OTHER): Admitting: Orthopaedic Surgery

## 2023-09-05 ENCOUNTER — Ambulatory Visit (HOSPITAL_BASED_OUTPATIENT_CLINIC_OR_DEPARTMENT_OTHER)

## 2023-09-05 DIAGNOSIS — S82252A Displaced comminuted fracture of shaft of left tibia, initial encounter for closed fracture: Secondary | ICD-10-CM

## 2023-09-05 DIAGNOSIS — S82402A Unspecified fracture of shaft of left fibula, initial encounter for closed fracture: Secondary | ICD-10-CM | POA: Diagnosis not present

## 2023-09-05 DIAGNOSIS — S82102A Unspecified fracture of upper end of left tibia, initial encounter for closed fracture: Secondary | ICD-10-CM | POA: Diagnosis not present

## 2023-09-05 NOTE — Progress Notes (Signed)
 Post Operative Evaluation    Procedure/Date of Surgery: Left tibial nail, bilateral scapular fractures  Interval History:   09/05/2023: Presents today for follow-up of the above procedure.  Today's visit he is having some irritation around his callus.  He is getting some soreness after a long day of work although is completely off of his cane   PMH/PSH/Family History/Social History/Meds/Allergies:    Past Medical History:  Diagnosis Date   Hyperthyroidism    Past Surgical History:  Procedure Laterality Date   TIBIA IM NAIL INSERTION Left 04/15/2022   Procedure: INTRAMEDULLARY (IM) NAIL TIBIAL;  Surgeon: Genelle Standing, MD;  Location: [facility] OR;  Service: Orthopedics;  Laterality: Left;   VASECTOMY  2019   Social History   Socioeconomic History   Marital status: Married    Spouse name: Not on file   Number of children: 2   Years of education: Not on file   Highest education level: Not on file  Occupational History   Not on file  Tobacco Use   Smoking status: Never   Smokeless tobacco: Never  Vaping Use   Vaping status: Never Used  Substance and Sexual Activity   Alcohol use: Yes    Alcohol/week: 2.0 - 4.0 standard drinks of alcohol    Types: 2 - 4 Shots of liquor per week    Comment: Occasional   Drug use: Yes    Types: Marijuana   Sexual activity: Yes    Birth control/protection: Surgical    Comment: vasectomy  Other Topics Concern   Not on file  Social History Narrative   Door assembly   Social Drivers of Health   Financial Resource Strain: Not on file  Food Insecurity: No Food Insecurity (04/16/2022)   Hunger Vital Sign    Worried About Running Out of Food in the Last Year: Never true    Ran Out of Food in the Last Year: Never true  Transportation Needs: No Transportation Needs (04/16/2022)   PRAPARE - Administrator, Civil Service (Medical): No    Lack of Transportation (Non-Medical): No  Physical Activity: Not on  file  Stress: Not on file  Social Connections: Not on file   Family History  Problem Relation Age of Onset   Hypertension Mother    Diabetes Mother    Alcohol abuse Maternal Grandmother    Hypertension Maternal Grandmother    Diabetes Maternal Grandmother    No Known Allergies Current Outpatient Medications  Medication Sig Dispense Refill   cephALEXin  (KEFLEX ) 500 MG capsule Take 1 capsule (500 mg total) by mouth 4 (four) times daily. 20 capsule 0   ibuprofen  (ADVIL ) 800 MG tablet Take 1 tablet (800 mg total) by mouth 3 (three) times daily. 21 tablet 0   meloxicam  (MOBIC ) 15 MG tablet TAKE 1 TABLET BY MOUTH EVERY DAY 90 tablet 0   methimazole  (TAPAZOLE ) 5 MG tablet Take 1 tablet (5 mg total) by mouth daily. 90 tablet 3   Multiple Vitamin (MULTIVITAMIN) tablet Take 1 tablet by mouth daily.     ondansetron  (ZOFRAN -ODT) 4 MG disintegrating tablet Take 1 tablet (4 mg total) by mouth every 8 (eight) hours as needed for vomiting. 30 tablet 0   oxyCODONE -acetaminophen  (PERCOCET) 5-325 MG tablet Take 2 tablets by mouth every 4 (four) hours as needed. 20 tablet 0  tamsulosin  (FLOMAX ) 0.4 MG CAPS capsule Take 1 capsule (0.4 mg total) by mouth daily. Until stone passes 30 capsule 0   No current facility-administered medications for this visit.   No results found.  Review of Systems:   A ROS was performed including pertinent positives and negatives as documented in the HPI.   Musculoskeletal Exam:    There were no vitals taken for this visit.    Left lower extremity knee range of motion is from 0 to 130  degrees.  Incisions are well-healed. Sensation is intact in all distributions fires EHL tibialis anterior as well as 2+ dorsalis pedis pulse  Imaging:    2 views left tib-fib: Status post intramedullary nailing with distal broken interlock screws consistent with self dynamization and subsequent significant callus I personally reviewed and interpreted the radiographs.   Assessment:    Status post intramedullary nailing of the left tibia without complication.  Overall he does have very significant increased callus formation since last x-ray.  I did discuss that he is having some irritation at this site as well as the proximal screw.  I did discuss that we could remove the screw.  I did discuss that at today's visit I would recommend topical Voltaren gel and I do believe his callus will continue to remodel as his medial tibia continues to heal.  At this point I will see to see him as needed Plan :    -Return to clinic as needed      I personally saw and evaluated the patient, and participated in the management and treatment plan.  Elspeth Parker, MD Attending Physician, Orthopedic Surgery  This document was dictated using Dragon voice recognition software. A reasonable attempt at proof reading has been made to minimize errors.

## 2023-09-22 DIAGNOSIS — N200 Calculus of kidney: Secondary | ICD-10-CM | POA: Diagnosis not present

## 2023-10-10 DIAGNOSIS — H6123 Impacted cerumen, bilateral: Secondary | ICD-10-CM | POA: Diagnosis not present

## 2023-12-28 ENCOUNTER — Emergency Department (HOSPITAL_COMMUNITY)

## 2023-12-28 ENCOUNTER — Encounter (HOSPITAL_COMMUNITY): Payer: Self-pay

## 2023-12-28 ENCOUNTER — Other Ambulatory Visit: Payer: Self-pay

## 2023-12-28 ENCOUNTER — Emergency Department (HOSPITAL_COMMUNITY)
Admission: EM | Admit: 2023-12-28 | Discharge: 2023-12-28 | Disposition: A | Discharge status: Home / Self Care | Attending: Emergency Medicine | Admitting: Emergency Medicine

## 2023-12-28 DIAGNOSIS — R10A2 Flank pain, left side: Secondary | ICD-10-CM | POA: Diagnosis present

## 2023-12-28 DIAGNOSIS — N134 Hydroureter: Secondary | ICD-10-CM | POA: Diagnosis not present

## 2023-12-28 DIAGNOSIS — R109 Unspecified abdominal pain: Secondary | ICD-10-CM | POA: Diagnosis not present

## 2023-12-28 DIAGNOSIS — N132 Hydronephrosis with renal and ureteral calculous obstruction: Secondary | ICD-10-CM | POA: Diagnosis not present

## 2023-12-28 DIAGNOSIS — N23 Unspecified renal colic: Secondary | ICD-10-CM | POA: Diagnosis not present

## 2023-12-28 HISTORY — DX: Disorder of kidney and ureter, unspecified: N28.9

## 2023-12-28 LAB — COMPREHENSIVE METABOLIC PANEL WITH GFR
ALT: 17 U/L (ref 0–44)
AST: 23 U/L (ref 15–41)
Albumin: 4.1 g/dL (ref 3.5–5.0)
Alkaline Phosphatase: 78 U/L (ref 38–126)
Anion gap: 11 (ref 5–15)
BUN: 15 mg/dL (ref 6–20)
CO2: 23 mmol/L (ref 22–32)
Calcium: 9.5 mg/dL (ref 8.9–10.3)
Chloride: 102 mmol/L (ref 98–111)
Creatinine, Ser: 1.34 mg/dL — ABNORMAL HIGH (ref 0.61–1.24)
GFR, Estimated: >60 mL/min (ref >60)
Glucose, Bld: 98 mg/dL (ref 70–99)
Potassium: 4 mmol/L (ref 3.5–5.1)
Sodium: 136 mmol/L (ref 135–145)
Total Bilirubin: 0.8 mg/dL (ref 0.0–1.2)
Total Protein: 7.6 g/dL (ref 6.5–8.1)

## 2023-12-28 LAB — CBC WITH DIFFERENTIAL/PLATELET
Abs Immature Granulocytes: 0.01 K/uL (ref 0.00–0.07)
Basophils Absolute: 0 K/uL (ref 0.0–0.1)
Basophils Relative: 0 %
Eosinophils Absolute: 0 K/uL (ref 0.0–0.5)
Eosinophils Relative: 0 %
HCT: 42.6 % (ref 39.0–52.0)
Hemoglobin: 14.3 g/dL (ref 13.0–17.0)
Immature Granulocytes: 0 %
Lymphocytes Relative: 9 %
Lymphs Abs: 0.7 K/uL (ref 0.7–4.0)
MCH: 28.9 pg (ref 26.0–34.0)
MCHC: 33.6 g/dL (ref 30.0–36.0)
MCV: 86.2 fL (ref 80.0–100.0)
Monocytes Absolute: 0.5 K/uL (ref 0.1–1.0)
Monocytes Relative: 6 %
Neutro Abs: 6.8 K/uL (ref 1.7–7.7)
Neutrophils Relative %: 85 %
Platelets: 244 K/uL (ref 150–400)
RBC: 4.94 MIL/uL (ref 4.22–5.81)
RDW: 13.1 % (ref 11.5–15.5)
WBC: 8 K/uL (ref 4.0–10.5)
nRBC: 0 % (ref 0.0–0.2)

## 2023-12-28 LAB — LIPASE, BLOOD: Lipase: 27 U/L (ref 11–51)

## 2023-12-28 LAB — I-STAT CHEM 8, ED
BUN: 16 mg/dL (ref 6–20)
Calcium, Ion: 1.21 mmol/L (ref 1.15–1.40)
Chloride: 102 mmol/L (ref 98–111)
Creatinine, Ser: 1.4 mg/dL — ABNORMAL HIGH (ref 0.61–1.24)
Glucose, Bld: 95 mg/dL (ref 70–99)
HCT: 44 % (ref 39.0–52.0)
Hemoglobin: 15 g/dL (ref 13.0–17.0)
Potassium: 3.9 mmol/L (ref 3.5–5.1)
Sodium: 139 mmol/L (ref 135–145)
TCO2: 26 mmol/L (ref 22–32)

## 2023-12-28 LAB — URINALYSIS, ROUTINE W REFLEX MICROSCOPIC
Bilirubin Urine: NEGATIVE
Glucose, UA: NEGATIVE mg/dL
Hgb urine dipstick: NEGATIVE
Ketones, ur: 5 mg/dL — AB
Leukocytes,Ua: NEGATIVE
Nitrite: NEGATIVE
Protein, ur: NEGATIVE mg/dL
Specific Gravity, Urine: 1.024 (ref 1.005–1.030)
pH: 7 (ref 5.0–8.0)

## 2023-12-28 MED ORDER — TAMSULOSIN HCL 0.4 MG PO CAPS: 0.4000 mg | ORAL_CAPSULE | Freq: Every day | ORAL | 0 refills | Status: AC

## 2023-12-28 MED ORDER — IOHEXOL 350 MG/ML SOLN
75.0000 mL | Freq: Once | INTRAVENOUS | Status: AC | PRN
  Administered 2023-12-28: 75 mL via INTRAVENOUS

## 2023-12-28 MED ORDER — ONDANSETRON HCL 4 MG/2ML IJ SOLN
4.0000 mg | Freq: Once | INTRAMUSCULAR | Status: AC
  Administered 2023-12-28: 4 mg via INTRAVENOUS
  Filled 2023-12-28: qty 2

## 2023-12-28 MED ORDER — KETOROLAC TROMETHAMINE 15 MG/ML IJ SOLN
15.0000 mg | Freq: Once | INTRAMUSCULAR | Status: AC
  Administered 2023-12-28: 15 mg via INTRAVENOUS
  Filled 2023-12-28: qty 1

## 2023-12-28 MED ORDER — MORPHINE SULFATE (PF) 2 MG/ML IV SOLN
4.0000 mg | Freq: Once | INTRAVENOUS | Status: AC
  Administered 2023-12-28: 4 mg via INTRAVENOUS
  Filled 2023-12-28: qty 2

## 2023-12-28 MED ORDER — HYDROCODONE-ACETAMINOPHEN 5-325 MG PO TABS: 1.0000 | ORAL_TABLET | Freq: Four times a day (QID) | ORAL | 0 refills | Status: AC | PRN

## 2023-12-28 MED ORDER — ONDANSETRON 4 MG PO TBDP: ORAL_TABLET | ORAL | 0 refills | Status: AC

## 2023-12-28 NOTE — Discharge Instructions (Addendum)
 As we discussed you have a small kidney stone on the left side  Please take Flomax  daily  Please take Tylenol  Motrin  for pain  You may take Norco for severe pain  Take Zofran  as needed for nausea  Please stay hydrated  Please follow-up with urology  Return to ER if you have severe abdominal pain or vomiting or fever

## 2023-12-28 NOTE — ED Triage Notes (Signed)
 Reports left flank pain that started around 0200 this morning with n/v.  Patient reports had kidney stones on the right approx 5 months ago and this feels similar.  Reports vomited 4 times.  Patient reports feels like he is not urinating enough.

## 2023-12-28 NOTE — ED Notes (Signed)
 PT given discharge instructions and verbalized understanding. Spouse present and is able to drive pt home. Wheelchair offered to pt but he declined.

## 2023-12-28 NOTE — ED Triage Notes (Signed)
 Nurse First Note: Pt with L sided flank pain that started at 04:00 this AM. Hx of nephrolithiasis.

## 2023-12-28 NOTE — ED Provider Notes (Signed)
 [facility] Provider Note   CSN: [id number] Arrival date & time: 12/28/23  1321     Patient presents with: Flank Pain   Alan Watkins is a 38 y.o. male history of kidney stone here presenting with left flank pain.  Patient was diagnosed with a right-sided kidney stone back in June.  He states that he passed it spontaneously.  Patient states that since last night he had left flank pain and associated with vomiting.  Patient tried to take ibuprofen  but it did not help with the pain.  Denies any dysuria or fever.  Patient received pain meds in triage and is feeling better now   The history is provided by the patient.       Prior to Admission medications   Medication Sig Start Date End Date Taking? Authorizing Provider  cephALEXin  (KEFLEX ) 500 MG capsule Take 1 capsule (500 mg total) by mouth 4 (four) times daily. 08/14/23   Mesner, Selinda, MD  ibuprofen  (ADVIL ) 800 MG tablet Take 1 tablet (800 mg total) by mouth 3 (three) times daily. 08/14/23   Mesner, Selinda, MD  meloxicam  (MOBIC ) 15 MG tablet TAKE 1 TABLET BY MOUTH EVERY DAY 06/19/23   Genelle Standing, MD  methimazole  (TAPAZOLE ) 5 MG tablet Take 1 tablet (5 mg total) by mouth daily. 02/03/23   Shamleffer, Ibtehal Jaralla, MD  Multiple Vitamin (MULTIVITAMIN) tablet Take 1 tablet by mouth daily.    [provider]  ondansetron  (ZOFRAN -ODT) 4 MG disintegrating tablet Take 1 tablet (4 mg total) by mouth every 8 (eight) hours as needed for vomiting. 08/14/23   Mesner, Selinda, MD  oxyCODONE -acetaminophen  (PERCOCET) 5-325 MG tablet Take 2 tablets by mouth every 4 (four) hours as needed. 08/14/23   Mesner, Selinda, MD  tamsulosin  (FLOMAX ) 0.4 MG CAPS capsule Take 1 capsule (0.4 mg total) by mouth daily. Until stone passes 08/14/23   Mesner, Selinda, MD    Allergies: Patient has no known allergies.    Review of Systems  Genitourinary:  Positive for flank pain.  All other systems reviewed and are  negative.   Updated Vital Signs BP 133/66 (BP Location: Right Arm)   Pulse (!) 45   Temp 98.7 F (37.1 C)   Resp 16   Ht 5' 10 (1.778 m)   Wt 77.1 kg   SpO2 100%   BMI 24.39 kg/m   Physical Exam Vitals and nursing note reviewed.  Constitutional:      Appearance: Normal appearance.  HENT:     Head: Normocephalic.     Nose: Nose normal.     Mouth/Throat:     Mouth: Mucous membranes are moist.  Eyes:     Extraocular Movements: Extraocular movements intact.     Pupils: Pupils are equal, round, and reactive to light.  Cardiovascular:     Rate and Rhythm: Normal rate and regular rhythm.     Pulses: Normal pulses.     Heart sounds: Normal heart sounds.  Pulmonary:     Effort: Pulmonary effort is normal.     Breath sounds: Normal breath sounds.  Abdominal:     General: Abdomen is flat.     Comments: Mild left CVAT  Musculoskeletal:        General: Normal range of motion.     Cervical back: Normal range of motion and neck supple.  Skin:    General: Skin is warm.     Capillary Refill: Capillary refill takes less than 2 seconds.  Neurological:     General: No focal deficit present.     Mental Status: He is alert.  Psychiatric:        Mood and Affect: Mood normal.        Behavior: Behavior normal.     (all labs ordered are listed, but only abnormal results are displayed) Labs Reviewed  COMPREHENSIVE METABOLIC PANEL WITH GFR - Abnormal; Notable for the following components:      Result Value   Creatinine, Ser 1.34 (*)    All other components within normal limits  URINALYSIS, ROUTINE W REFLEX MICROSCOPIC - Abnormal; Notable for the following components:   Ketones, ur 5 (*)    All other components within normal limits  I-STAT CHEM 8, ED - Abnormal; Notable for the following components:   Creatinine, Ser 1.40 (*)    All other components within normal limits  LIPASE, BLOOD  CBC WITH DIFFERENTIAL/PLATELET    EKG: None  Radiology: CT ABDOMEN PELVIS W  CONTRAST Result Date: 12/28/2023 CLINICAL DATA:  Abdominal pain EXAM: CT ABDOMEN AND PELVIS WITH CONTRAST TECHNIQUE: Multidetector CT imaging of the abdomen and pelvis was performed using the standard protocol following bolus administration of intravenous contrast. RADIATION DOSE REDUCTION: This exam was performed according to the departmental dose-optimization program which includes automated exposure control, adjustment of the mA and/or kV according to patient size and/or use of iterative reconstruction technique. CONTRAST:  75mL OMNIPAQUE  IOHEXOL  350 MG/ML SOLN COMPARISON:  08/14/2023 FINDINGS: Lower chest: No acute pleural or parenchymal lung disease. Hepatobiliary: No focal liver abnormality is seen. No gallstones, gallbladder wall thickening, or biliary dilatation. Pancreas: Unremarkable. No pancreatic ductal dilatation or surrounding inflammatory changes. Spleen: Normal in size without focal abnormality. Adrenals/Urinary Tract: There is a punctate less than 2 mm obstructing distal left ureteral calculus, reference image 69/3. There is mild left-sided hydronephrosis and hydroureter, with high-grade obstructive uropathy manifested by delayed enhancement of the left kidney. There are 2 additional punctate less than 2 mm nonobstructing left renal calculi. No right-sided calculi or obstruction. The adrenals and bladder are unremarkable. Stomach/Bowel: No bowel obstruction or ileus. No bowel wall thickening or inflammatory change. Vascular/Lymphatic: No significant vascular findings are present. No enlarged abdominal or pelvic lymph nodes. Reproductive: Prostate is unremarkable. Other: No free fluid or free intraperitoneal gas. No abdominal wall hernia. Musculoskeletal: No acute or destructive bony abnormalities. Reconstructed images demonstrate no additional findings. IMPRESSION: 1. Obstructing less than 2 mm distal left ureteral calculus, with high-grade left-sided obstructive uropathy. 2. Additional punctate  less than 2 mm nonobstructing left renal calculi as above. Electronically Signed   By: Ozell Daring M.D.   On: 12/28/2023 15:58     Procedures   Medications Ordered in the ED  ondansetron  (ZOFRAN ) injection 4 mg (4 mg Intravenous Given 12/28/23 1408)  morphine (PF) 2 MG/ML injection 4 mg (4 mg Intravenous Given 12/28/23 1407)  iohexol  (OMNIPAQUE ) 350 MG/ML injection 75 mL (75 mLs Intravenous Contrast Given 12/28/23 1537)  ketorolac  (TORADOL ) 15 MG/ML injection 15 mg (15 mg Intravenous Given 12/28/23 1648)                                    Medical Decision Making Alan Watkins is a 38 y.o. male who presented with left flank pain.  History of kidney stone I suspect renal colic.  Plan to get CBC and CMP and UA and CT renal stone  5:56 PM I reviewed  patient's labs and they were unremarkable.  UA showed no UTI.  I reviewed patient's CT scan and it showed a 2 mm left ureteral calculus.  Patient's pain is under control after Toradol  and morphine.  Stable for discharge with pain medicine and Motrin  and Flomax  and urology follow up    Problems Addressed: Renal colic on left side: acute illness or injury  Amount and/or Complexity of Data Reviewed Labs: ordered. Decision-making details documented in ED Course. Radiology: ordered and independent interpretation performed. Decision-making details documented in ED Course.     Final diagnoses:  None    ED Discharge Orders     None          Patt Alm Macho, MD 12/28/23 1757

## 2023-12-28 NOTE — ED Provider Triage Note (Signed)
 Emergency Medicine Provider Triage Evaluation Note  Alan Watkins , a 38 y.o. male  was evaluated in triage.  Pt complains of left flank pain, nausea, vomiting since 2AM. Advil  and oxy did not help. Has hx of kidney stone. Denies hematemesis, diarrhea, dysuria, hematuria.  Review of Systems  Positive:  Negative:   Physical Exam  BP 128/71 (BP Location: Left Arm)   Pulse (!) 55   Temp 98.9 F (37.2 C)   Resp (!) 23   SpO2 100%  Gen:   Awake, no distress   Resp:  Normal effort  MSK:   Moves extremities without difficulty  Other:    Medical Decision Making  Medically screening exam initiated at 1:52 PM.  Appropriate orders placed.  Alan Watkins was informed that the remainder of the evaluation will be completed by another provider, this initial triage assessment does not replace that evaluation, and the importance of remaining in the ED until their evaluation is complete.     Hoy Fraction F, [place] 12/28/23 1354

## 2024-01-01 DIAGNOSIS — N202 Calculus of kidney with calculus of ureter: Secondary | ICD-10-CM | POA: Diagnosis not present

## 2024-01-01 DIAGNOSIS — R399 Unspecified symptoms and signs involving the genitourinary system: Secondary | ICD-10-CM | POA: Diagnosis not present

## 2024-01-12 ENCOUNTER — Encounter: Payer: Self-pay | Admitting: Radiology

## 2024-02-03 DIAGNOSIS — N202 Calculus of kidney with calculus of ureter: Secondary | ICD-10-CM | POA: Diagnosis not present

## 2024-02-03 DIAGNOSIS — N200 Calculus of kidney: Secondary | ICD-10-CM | POA: Diagnosis not present

## 2024-03-05 ENCOUNTER — Encounter: Payer: Self-pay | Admitting: Family Medicine

## 2024-03-05 ENCOUNTER — Encounter: Payer: BC Managed Care – PPO | Admitting: Family Medicine

## 2024-03-12 ENCOUNTER — Other Ambulatory Visit: Payer: Self-pay | Admitting: Internal Medicine

## 2024-05-13 ENCOUNTER — Encounter: Admitting: Family Medicine
